# Patient Record
Sex: Female | Born: 1990 | Race: Black or African American | Hispanic: No | Marital: Married | Smoking: Current some day smoker
Health system: Southern US, Community
[De-identification: ages and names within clinical notes are randomized; demographics above are authoritative.]

## PROBLEM LIST (undated history)

## (undated) DIAGNOSIS — N760 Acute vaginitis: Secondary | ICD-10-CM

## (undated) DIAGNOSIS — B9689 Other specified bacterial agents as the cause of diseases classified elsewhere: Secondary | ICD-10-CM

---
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
## 2012-04-28 ENCOUNTER — Emergency Department (HOSPITAL_COMMUNITY)
Admission: EM | Admit: 2012-04-28 | Discharge: 2012-04-28 | Disposition: A | Discharge status: Home / Self Care | Payer: Self-pay | Attending: Emergency Medicine | Admitting: Emergency Medicine

## 2012-04-28 ENCOUNTER — Encounter (HOSPITAL_COMMUNITY): Payer: Self-pay

## 2012-04-28 DIAGNOSIS — J020 Streptococcal pharyngitis: Secondary | ICD-10-CM | POA: Insufficient documentation

## 2012-04-28 DIAGNOSIS — O98819 Other maternal infectious and parasitic diseases complicating pregnancy, unspecified trimester: Secondary | ICD-10-CM | POA: Insufficient documentation

## 2012-04-28 DIAGNOSIS — O99330 Smoking (tobacco) complicating pregnancy, unspecified trimester: Secondary | ICD-10-CM | POA: Insufficient documentation

## 2012-04-28 MED ORDER — PENICILLIN V POTASSIUM 500 MG PO TABS: 500.0000 mg | ORAL_TABLET | Freq: Four times a day (QID) | ORAL | Status: DC

## 2012-04-28 MED ORDER — PENICILLIN V POTASSIUM 250 MG PO TABS
500.0000 mg | ORAL_TABLET | Freq: Once | ORAL | Status: AC
  Administered 2012-04-28: 500 mg via ORAL
  Filled 2012-04-28: qty 2

## 2012-04-28 NOTE — ED Notes (Signed)
Called in waiting room x 2 no answer  

## 2012-04-28 NOTE — ED Notes (Signed)
Unable to locate pt  

## 2012-04-28 NOTE — ED Provider Notes (Signed)
History     CSN: [id number]  Arrival date & time 04/28/12  0012   None     Chief Complaint  Patient presents with  . Sore Throat    (Consider location/radiation/quality/duration/timing/severity/associated sxs/prior treatment) HPI Comments: She reports, that she's had a sore throat for over a week, with some chills, and myalgias.  She is also, reports, that she is 3, weeks pregnant [facility] with a quantitative beta her level was 84.  She is to followup with him on September 23 for a repeat quantitative beta.  She does not report any abdominal pain, discomfort, vaginal bleeding, or discharge  Patient is a 21 y.o. female presenting with pharyngitis. The history is provided by the patient.  Sore Throat The current episode started in the past 7 days. The problem occurs constantly. The problem has been unchanged. Associated symptoms include chills, a fever and a sore throat. Pertinent negatives include no anorexia, nausea or rash.    History reviewed. No pertinent past medical history.  History reviewed. No pertinent past surgical history.  History reviewed. No pertinent family history.  History  Substance Use Topics  . Smoking status: Current Some Day Smoker  . Smokeless tobacco: Not on file  . Alcohol Use: No    OB History    Grav Para Term Preterm Abortions TAB SAB Ect Mult Living   1               Review of Systems  Constitutional: Positive for fever and chills.  HENT: Positive for sore throat.   Gastrointestinal: Negative for nausea, diarrhea and anorexia.  Genitourinary: Negative for dysuria, vaginal bleeding, vaginal discharge and vaginal pain.  Skin: Negative for rash and wound.  Neurological: Negative for dizziness.    Allergies  Review of patient's allergies indicates no known allergies.  Home Medications   Current Outpatient Rx  Name Route Sig Dispense Refill  . DM-GUAIFENESIN ER 30-600 MG PO TB12 Oral Take 1 tablet by mouth every 12 (twelve)  hours.    Marland Kitchen PENICILLIN V POTASSIUM 500 MG PO TABS Oral Take 1 tablet (500 mg total) by mouth 4 (four) times daily. 39 tablet 0    BP 119/59  Pulse 84  Temp 98.5 F (36.9 C) (Oral)  Resp 20  Ht 5\' 1"  (1.549 m)  Wt 95 lb (43.092 kg)  BMI 17.95 kg/m2  SpO2 100%  Physical Exam  Constitutional: She appears well-developed and well-nourished.  HENT:  Head: Normocephalic. No trismus in the jaw.  Mouth/Throat: Uvula is midline. No oral lesions. No lacerations. Oropharyngeal exudate and posterior oropharyngeal erythema present. No posterior oropharyngeal edema or tonsillar abscesses.  Neck: Normal range of motion.  Cardiovascular: Normal rate.   Abdominal: Soft.  Musculoskeletal: Normal range of motion.  Neurological: She is alert.  Skin: Skin is warm.    ED Course  Procedures (including critical care time)  Labs Reviewed  RAPID STREP SCREEN - Abnormal; Notable for the following:    Streptococcus, Group A Screen (Direct) POSITIVE (*)     All other components within normal limits   No results found.   1. Strep pharyngitis       MDM   Verified with pharmacy that Pen-Vee K is safe in pregnancy and lactation.  We'll start this patient on a 10 day course of Pen-Vee K and have her followup on Monday as scheduled for repeat quantitative beta       Arman Filter, NP 04/28/12 0223  Arman Filter,  NP 04/28/12 1610

## 2012-04-28 NOTE — ED Notes (Signed)
Pt reports sore throat x1 week and (L) ear pain starting tonight. Pt reports she is [redacted] weeks pregnant w/no complications

## 2012-05-29 NOTE — ED Provider Notes (Signed)
Medical screening examination/treatment/procedure(s) were performed by non-physician practitioner and as supervising physician I was immediately available for consultation/collaboration.  Brandt Loosen, MD 05/29/12 [phone]

## 2014-03-06 ENCOUNTER — Encounter (HOSPITAL_COMMUNITY): Payer: Self-pay | Admitting: Emergency Medicine

## 2014-03-06 ENCOUNTER — Emergency Department (HOSPITAL_COMMUNITY)
Admission: EM | Admit: 2014-03-06 | Discharge: 2014-03-06 | Disposition: A | Discharge status: Home / Self Care | Payer: Medicaid Other | Attending: Emergency Medicine | Admitting: Emergency Medicine

## 2014-03-06 DIAGNOSIS — N9489 Other specified conditions associated with female genital organs and menstrual cycle: Secondary | ICD-10-CM | POA: Insufficient documentation

## 2014-03-06 DIAGNOSIS — F17200 Nicotine dependence, unspecified, uncomplicated: Secondary | ICD-10-CM | POA: Diagnosis not present

## 2014-03-06 DIAGNOSIS — L0231 Cutaneous abscess of buttock: Secondary | ICD-10-CM | POA: Diagnosis not present

## 2014-03-06 DIAGNOSIS — K612 Anorectal abscess: Secondary | ICD-10-CM | POA: Insufficient documentation

## 2014-03-06 DIAGNOSIS — N898 Other specified noninflammatory disorders of vagina: Secondary | ICD-10-CM

## 2014-03-06 DIAGNOSIS — L03317 Cellulitis of buttock: Principal | ICD-10-CM

## 2014-03-06 LAB — URINE MICROSCOPIC-ADD ON

## 2014-03-06 LAB — WET PREP, GENITAL
Clue Cells Wet Prep HPF POC: NONE SEEN
Trich, Wet Prep: NONE SEEN
YEAST WET PREP: NONE SEEN

## 2014-03-06 LAB — URINALYSIS, ROUTINE W REFLEX MICROSCOPIC
Bilirubin Urine: NEGATIVE
Glucose, UA: NEGATIVE mg/dL
KETONES UR: NEGATIVE mg/dL
NITRITE: NEGATIVE
PH: 6.5 (ref 5.0–8.0)
Protein, ur: NEGATIVE mg/dL
Specific Gravity, Urine: 1.027 (ref 1.005–1.030)
Urobilinogen, UA: 1 mg/dL (ref 0.0–1.0)

## 2014-03-06 MED ORDER — SULFAMETHOXAZOLE-TRIMETHOPRIM 800-160 MG PO TABS: 1.0000 | ORAL_TABLET | Freq: Two times a day (BID) | ORAL | Status: DC

## 2014-03-06 NOTE — ED Notes (Addendum)
Pt reports bump/ abscess on lower right buttocks, pain 9/10 x3 days. Reports there is a lump on her one of her vaginal walls that she noticed 2 days ago, denies vaginal pain. Pt does not have periods due to being on Depo shot.

## 2014-03-06 NOTE — ED Notes (Signed)
PA at bedside.

## 2014-03-06 NOTE — ED Notes (Signed)
Patient's mother driving her home. Pt ambulatory upon d/c

## 2014-03-06 NOTE — ED Notes (Addendum)
Pt aware of the need for a urine sample.   Pelvic exam supplies at bedside.

## 2014-03-06 NOTE — Discharge Instructions (Signed)
Bartholin's Cyst or Abscess Bartholin's glands are small glands located within the folds of skin (labia) along the sides of the lower opening of the vagina (birth canal). A cyst may develop when the duct of the gland becomes blocked. When this happens, fluid that accumulates within the cyst can become infected. This is known as an abscess. The Bartholin gland produces a mucous fluid to lubricate the outside of the vagina during sexual intercourse. SYMPTOMS   Patients with a small cyst may not have any symptoms.  Mild discomfort to severe pain depending on the size of the cyst and if it is infected (abscess).  Pain, redness, and swelling around the lower opening of the vagina.  Painful intercourse.  Pressure in the perineal area.  Swelling of the lips of the vagina (labia).  The cyst or abscess can be on one side or both sides of the vagina. DIAGNOSIS   A large swelling is seen in the lower vagina area by your caregiver.  Painful to touch.  Redness and pain, if it is an abscess. TREATMENT   Sometimes the cyst will go away on its own.  Apply warm wet compresses to the area or take hot sitz baths several times a day.  An incision to drain the cyst or abscess with local anesthesia.  Culture the pus, if it is an abscess.  Antibiotic treatment, if it is an abscess.  Cut open the gland and suture the edges to make the opening of the gland bigger (marsupialization).  Remove the whole gland if the cyst or abscess returns. PREVENTION   Practice good hygiene.  Clean the vaginal area with a mild soap and soft cloth when bathing.  Do not rub hard in the vaginal area when bathing.  Protect the crotch area with a padded cushion if you take long bike rides or ride horses.  Be sure you are well lubricated when you have sexual intercourse. HOME CARE INSTRUCTIONS   If your cyst or abscess was opened, a small piece of gauze, or a drain, may have been placed in the wound to allow  drainage. Do not remove this gauze or drain unless directed by your caregiver.  Wear feminine pads, not tampons, as needed for any drainage or bleeding.  If antibiotics were prescribed, take them exactly as directed. Finish the entire course.  Only take over-the-counter or prescription medicines for pain, discomfort, or fever as directed by your caregiver. SEEK IMMEDIATE MEDICAL CARE IF:   You have an increase in pain, redness, swelling, or drainage.  You have bleeding from the wound which results in the use of more than the number of pads suggested by your caregiver in 24 hours.  You have chills.  You have a fever.  You develop any new problems (symptoms) or aggravation of your existing condition. MAKE SURE YOU:   Understand these instructions.  Will watch your condition.  Will get help right away if you are not doing well or get worse. Document Released: 07/29/2005 Document Revised: 10/21/2011 Document Reviewed: 03/16/2008 [facility] Patient Information 2015 [place], [place]. This information is not intended to replace advice given to you by your health care provider. Make sure you discuss any questions you have with your health care provider.  Abscess Care After An abscess (also called a boil or furuncle) is an infected area that contains a collection of pus. Signs and symptoms of an abscess include pain, tenderness, redness, or hardness, or you may feel a moveable soft area under your skin. An  abscess can occur anywhere in the body. The infection may spread to surrounding tissues causing cellulitis. A cut (incision) by the surgeon was made over your abscess and the pus was drained out. Gauze may have been packed into the space to provide a drain that will allow the cavity to heal from the inside outwards. The boil may be painful for 5 to 7 days. Most people with a boil do not have high fevers. Your abscess, if seen early, may not have localized, and may not have been lanced. If not,  another appointment may be required for this if it does not get better on its own or with medications. HOME CARE INSTRUCTIONS   Only take over-the-counter or prescription medicines for pain, discomfort, or fever as directed by your caregiver.  When you bathe, soak and then remove gauze or iodoform packs at least daily or as directed by your caregiver. You may then wash the wound gently with mild soapy water. Repack with gauze or do as your caregiver directs. SEEK IMMEDIATE MEDICAL CARE IF:   You develop increased pain, swelling, redness, drainage, or bleeding in the wound site.  You develop signs of generalized infection including muscle aches, chills, fever, or a general ill feeling.  An oral temperature above 102 F (38.9 C) develops, not controlled by medication. See your caregiver for a recheck if you develop any of the symptoms described above. If medications (antibiotics) were prescribed, take them as directed. Document Released: 02/14/2005 Document Revised: 10/21/2011 Document Reviewed: 10/12/2007 [facility] Patient Information 2015 [place], [place]. This information is not intended to replace advice given to you by your health care provider. Make sure you discuss any questions you have with your health care provider.   Emergency Department Resource Guide 1) Find a Doctor and Pay Out of Pocket Although you won't have to find out who is covered by your insurance plan, it is a good idea to ask around and get recommendations. You will then need to call the office and see if the doctor you have chosen will accept you as a new patient and what types of options they offer for patients who are self-pay. Some doctors offer discounts or will set up payment plans for their patients who do not have insurance, but you will need to ask so you aren't surprised when you get to your appointment.  2) Contact Your Local Health Department Not all health departments have doctors that can see patients for  sick visits, but many do, so it is worth a call to see if yours does. If you don't know where your local health department is, you can check in your phone book. The CDC also has a tool to help you locate your state's health department, and many state websites also have listings of all of their local health departments.  3) Find a Walk-in Clinic If your illness is not likely to be very severe or complicated, you may want to try a walk in clinic. These are popping up all over the country in pharmacies, drugstores, and shopping centers. They're usually staffed by nurse practitioners or physician assistants that have been trained to treat common illnesses and complaints. They're usually fairly quick and inexpensive. However, if you have serious medical issues or chronic medical problems, these are probably not your best option.  No Primary Care Doctor: - Call Health Connect at  [phone] - they can help you locate a primary care doctor that  accepts your insurance, provides certain services, etc. - Physician  Referral Service- [phone]  Chronic Pain Problems: Organization         Address  Phone   Notes  [facility]  [phone] Patients need to be referred by their primary care doctor.   Medication Assistance: Organization         Address  Phone   Notes  [facility] Medication [facility] [address] [employer] [place], [place] [address] [phone] --Must be a resident of [facility] -- Must have NO insurance coverage whatsoever (no Medicaid/ Medicare, etc.) -- The pt. MUST have a primary care doctor that directs their care regularly and follows them in the community   [facility]  [phone]   [employer]  [phone]    Agencies that provide inexpensive medical care: Organization         Address  Phone   Notes  [facility]  [phone]   [facility]    [phone]   [facility]  [address] [place], [place] [address] [phone]   [facility] [address] [place] [place] [phone]   Planned Parenthood    [phone]   [facility]    [phone]   [facility]  [address], [place] Phone:  [phone], Fax:  [phone] Hours of Operation:  9 am - 6 pm, M-F.  Also accepts Medicaid/Medicare and self-pay.  [facility]  [address], [place] Phone: [phone], Fax: [phone]. Hours of Operation:  8:30 am - 5:30 pm, M-F.  Also accepts Medicaid and self-pay.  [facility] [place] [address] [place] Phone: [phone]   [facility] [address] Natasha Bence [place], [place] [phone], Ext. [phone] Mondays & Thursdays: 7-9 AM.  First 15 patients are seen on a first come, first serve basis.    Medicaid-accepting [facility] Providers:  Organization         Address  Phone   Notes  [facility] [address],  [phone] Also accepts self-pay patients.  [facility] [address] Laurell Josephs [place], [place]  [phone]   [facility] [address], [place] [phone]   [facility] [address], [place] [phone]   Renaye Rakers [address], [place]   [phone] Only accepts [place] patients after they have their name applied to their card.   Self-Pay (no insurance) in [place]:  Organization         Address  Phone   Notes  Sickle Cell Patients, [facility] [address] [place], [place] [phone]   [facility] [address] [place], [place] [phone]   [facility] [place]  [address], [place] [phone]   [facility]/Dr. Osei-Bonsu  [address] [place] or [address], [place] [phone] Phone number for both [place] and [place] locations is the same.  [facility] [address] [place] [phone]   [facility] [address] [place] or [address] [phone]   [facility] [address] [place], [place] [phone], phone; [phone], fax Sees patients 1st and 3rd Saturday of every month.  Must not qualify for public or private insurance (i.e. Medicaid, Medicare, [place] Health Choice, Veterans' Benefits)  Household income should be no more than 200% of the poverty level The clinic cannot treat you if you are pregnant or think you are pregnant  Sexually transmitted diseases are not treated at the clinic.    Dental Care: Organization         Address  Phone  Notes  [facility] of [facility] [address] [place], [place] [phone] Accepts children up to age 26 who are enrolled in [place] or [place] Health Choice; pregnant women with a Medicaid card; and children who have applied for Medicaid or [place] Health Choice, but were declined, whose parents can pay a reduced fee at time of service.  [facility] Department of [facility]  [address], [place] [phone] Accepts children up to age 36 who are enrolled in [place] or [place] Health Choice; pregnant women with a Medicaid card; and children who have applied for Medicaid or [place] Health Choice, but were declined, whose parents can pay a reduced fee at time of service.  [facility]  [address] [place], [place] [phone] Patients are seen by appointment only. Walk-ins are not accepted. [facility] will see patients 68 years of age and older. Monday - Tuesday (8am-5pm) Most Wednesdays (8:30-5pm) $30 per visit, cash only  [facility] Adult Dental Access PROGRAM  [address], [facility] [phone] Patients are seen by appointment only. Walk-ins are not accepted. [facility] will see patients 68 years of age and older. One Wednesday Evening (Monthly: Volunteer Based).  $30 per visit, cash only  [employer]  [phone] for adults; Children under age 67, call Graduate Pediatric Dentistry at [phone]. Children aged 78-14, please call [phone] to request a pediatric application.  Dental services are provided in all areas of dental care including fillings, crowns and bridges, complete and partial dentures, implants, gum treatment, root canals, and extractions. Preventive care is also provided. Treatment is provided to both adults and children. Patients are selected via a lottery and there is often a waiting list.   [facility] [address], [place]  [phone] [email or web address]   [facility] [address] [place], [place] [phone], Ext. [phone] Second and Fourth Thursday of each month, opens at 6:30 AM; Clinic ends at 9 AM.  Patients are seen on a first-come first-served basis, and a limited number are seen during each clinic.   [facility]  [address] Ether Griffins [place], [place] [phone]   Eligibility Requirements You must have lived in [place], [place], or [place] counties for at least the last three months.   You cannot be eligible for state or federal sponsored [place], including [employer], [place], or [employer].   You generally cannot be eligible for healthcare insurance through your employer.    How to apply: Eligibility screenings are held every Tuesday and Wednesday afternoon from 1:00 pm until 4:00 pm. You do not need an appointment for the interview!  [facility] [address] [place], [place] [phone]   [facility] Health Department  [phone]   [facility] Health Department  [phone]     [facility] Department  [phone]    Behavioral Health Resources in  the Community: Intensive Outpatient Programs Organization         Address  Phone  Notes  [facility] [address], [place], [place] [phone]   [facility] [address], [place], [place] [phone]   [facility] [address], [place], [place]  [phone]   [facility] [address],  [place], [place] [phone] or [phone]   Substance Abuse Resources Organization         Address  Phone  Notes  [facility]  [phone]   [facility]  [phone]   [facility]  [phone]   [facility]  [phone]   Residential & Outpatient Substance Abuse Program  [phone]   Psychological Services Organization         Address  Phone  Notes  [facility]  [phone]- [phone]   [facility]  [phone]- [phone]   [facility] [address], [place] [phone] or [phone]    Mobile Crisis Teams Organization         Address  Phone  Notes  [facility], Mobile Crisis Care Unit  [phone]   [facility]  [address] [place], [place] [phone]   Doristine LocksSharon DeEsch [address] [place] [phone]    Self-Help/Support Groups Organization         Address  Phone             Notes  [facility]ental Health Assoc. of [place] - variety of support groups  336- [phone] Call for more information  Narcotics Anonymous (NA), [facility] [address], [place]  2 meetings at this location   Statisticianesidential Treatment Programs Organization         Address  Phone  Notes  [facility] [address],    [place]  [phone]   [facility]  [address], [place] [phone], [place], [place] [phone]   [facility] [address], [place]  [phone] Admissions: 8am-3pm M-F  [facility] [address]    [place], [place] [phone]   [facility] [address], [place], [place] [phone]   [facility] [address]  [place], [place] [phone]   [facility] - Intensive Outpatient [address], [place], [place] [phone]   [facility] ([facility] [address]  [place], [place] [phone] or [phone]   [facility] ([facility]) [address] [place], [place] [phone] Accepts Medicaid  [facility] [address]  [place] [phone] Substance Abuse/Addiction Treatment   [facility] Resources Organization         Address  Phone  Notes  [facility]  [phone]) [phone]   Angie FavaJulie Brannon, PhD [address] [place], [place]   [phone] or [phone]   [facility]   [address] [place], [place] [phone]) [phone]   [facility] [address], [place], [place] [phone] Insurance/Medicaid/sponsorship through [employer] [facility]  [facility] [address]                                    [place], [place] [phone] Therapy/tele-psych/case  [facility] [address]   [place], [place] [phone]  Dr. Adele Schilder  [phone]   [facility] Dept. 1) [address], [place] 2) [place] 3)  [place] [address], [place] [phone]  [phone]   [place] [phone] or [phone] (After Hours)

## 2014-03-06 NOTE — ED Provider Notes (Signed)
CSN: [id number]     Arrival date & time 03/06/14  1041 History   First MD Initiated Contact with Patient 03/06/14 1108     Chief Complaint  Patient presents with  . Abscess   HPI Comments: Patient also mentions that she has a pea sized growth that is located on the labia of the vagina which she found two days ago.  Patient states that she has not had any vaginal pain, itching, rash, or discharge. She denies any urinary symptoms or changes in her bowel habits.  Patient is a 23 y.o. female presenting with abscess. The history is provided by the patient. No language interpreter was used.  Abscess Location:  Ano-genital Ano-genital abscess location:  R buttock Size:  3 in x 2 in Abscess quality: draining, fluctuance, induration, painful, redness and warmth   Abscess quality: no itching and not weeping   Red streaking: no   Duration:  4 days Progression:  Worsening Pain details:    Quality:  Sharp and shooting   Severity:  Severe   Duration:  4 days   Timing:  Constant   Progression:  Worsening Chronicity:  New Context: not diabetes, not immunosuppression, not injected drug use, not insect bite/sting and not skin injury   Relieved by:  Nothing Exacerbated by: pressure. Ineffective treatments:  NSAIDs, warm water soaks, warm compresses and topical antibiotics Associated symptoms: no anorexia, no fatigue, no fever, no headaches, no nausea and no vomiting   Risk factors: no family hx of MRSA, no hx of MRSA and no prior abscess     History reviewed. No pertinent past medical history. History reviewed. No pertinent past surgical history. History reviewed. No pertinent family history. History  Substance Use Topics  . Smoking status: Current Some Day Smoker  . Smokeless tobacco: Not on file  . Alcohol Use: No   OB History   Grav Para Term Preterm Abortions TAB SAB Ect Mult Living   1              Review of Systems  Constitutional: Negative for fever, chills and fatigue.    Gastrointestinal: Negative for nausea, vomiting and anorexia.  Genitourinary: Negative for dysuria, urgency, frequency, hematuria, flank pain, decreased urine volume, vaginal bleeding, vaginal discharge, vaginal pain, menstrual problem and pelvic pain.  Skin: Positive for wound.  Neurological: Negative for headaches.  All other systems reviewed and are negative.     Allergies  Review of patient's allergies indicates no known allergies.  Home Medications   Prior to Admission medications   Medication Sig Start Date End Date Taking? Authorizing Provider  acetaminophen (TYLENOL) 500 MG tablet Take 1,000 mg by mouth every 6 (six) hours as needed for mild pain.   Yes Historical Provider, MD  medroxyPROGESTERone (DEPO-PROVERA) 150 MG/ML injection Inject 150 mg into the muscle every 3 (three) months.   Yes Historical Provider, MD  sulfamethoxazole-trimethoprim (SEPTRA DS) 800-160 MG per tablet Take 1 tablet by mouth every 12 (twelve) hours. 03/06/14   Alea Ryer A Forcucci, PA-C   BP 99/52  Pulse 80  Temp(Src) 98.4 F (36.9 C) (Oral)  Resp 18  SpO2 100%  Breastfeeding? Unknown Physical Exam  Nursing note and vitals reviewed. Constitutional: She is oriented to person, place, and time. She appears well-developed and well-nourished. No distress.  HENT:  Head: Normocephalic and atraumatic.  Mouth/Throat: Oropharynx is clear and moist. No oropharyngeal exudate.  Eyes: Conjunctivae and EOM are normal. Pupils are equal, round, and reactive to light. No scleral icterus.  Neck: Normal range of motion. Neck supple. No JVD present. No thyromegaly present.  Cardiovascular: Normal rate, regular rhythm, normal heart sounds and intact distal pulses.  Exam reveals no gallop and no friction rub.   No murmur heard. Pulmonary/Chest: Effort normal and breath sounds normal. No respiratory distress. She has no wheezes. She has no rales. She exhibits no tenderness.  Abdominal: Soft. Bowel sounds are normal.  She exhibits no distension and no mass. There is no tenderness. There is no rebound and no guarding.  Genitourinary: Uterus normal.    Pelvic exam was performed with patient supine. Cervix exhibits no motion tenderness, no discharge and no friability. Right adnexum displays no mass, no tenderness and no fullness. Left adnexum displays no mass, no tenderness and no fullness. No erythema, tenderness or bleeding around the vagina. No foreign body around the vagina. No signs of injury around the vagina. No vaginal discharge found.  Musculoskeletal: Normal range of motion.  Lymphadenopathy:    She has no cervical adenopathy.  Neurological: She is alert and oriented to person, place, and time.  Skin: Skin is warm and dry. She is not diaphoretic.  3 in x 2 in abscess on the right buttock with some active drainage of yellow purulent substance.  Abscess has no surrounding erythema or warmth.  There is tenderness to palpation.  There is also a pustule on the right buttocks above the abscess with no fluctuance at this time.   Psychiatric: She has a normal mood and affect. Her behavior is normal. Judgment and thought content normal.    ED Course  INCISION AND DRAINAGE Date/Time: 03/06/2014 1:52 PM Performed by: Terri Piedra A Authorized by: Terri Piedra A Consent: Verbal consent obtained. Risks and benefits: risks, benefits and alternatives were discussed Consent given by: patient Patient understanding: patient states understanding of the procedure being performed Patient consent: the patient's understanding of the procedure matches consent given Relevant documents: relevant documents present and verified Test results: test results available and properly labeled Imaging studies: imaging studies available Patient identity confirmed: verbally with patient Time out: Immediately prior to procedure a "time out" was called to verify the correct patient, procedure, equipment, support staff and  site/side marked as required. Type: abscess Body area: anogenital Location details: perianal Anesthesia: local infiltration Local anesthetic: lidocaine 2% without epinephrine Anesthetic total: 3 ml Patient sedated: no Scalpel size: 10 Incision type: two simple straight incision made at both ends of the abscess. Complexity: simple Drainage: purulent and  bloody Drainage amount: moderate Wound treatment: wound left open Patient tolerance: Patient tolerated the procedure well with no immediate complications.   (including critical care time) Labs Review Labs Reviewed  WET PREP, GENITAL - Abnormal; Notable for the following:    WBC, Wet Prep HPF POC FEW (*)    All other components within normal limits  URINALYSIS, ROUTINE W REFLEX MICROSCOPIC - Abnormal; Notable for the following:    APPearance TURBID (*)    Hgb urine dipstick MODERATE (*)    Leukocytes, UA TRACE (*)    All other components within normal limits  URINE MICROSCOPIC-ADD ON - Abnormal; Notable for the following:    Squamous Epithelial / LPF FEW (*)    All other components within normal limits  GC/CHLAMYDIA PROBE AMP    Imaging Review No results found.   EKG Interpretation None      MDM   Final diagnoses:  Abscess of buttock, right  Vaginal mass   Patient is a 23 y.o. Female who presents  to the ED with multiple complaints.  Patient has abscess to the right buttock.  Abscess was I&D'd as seen above at this time.  Patient had multiple other pustules, but no surrounding cellulitis at this time.  I will prescribe the patient Bactrim DS BID x 7 days.  Patient also had small pea sized mass on the left labia.  Differential includes scar tissue and bartholin's gland.  Patient was told to follow-up with [facility] of her choice in [place] as that is where she is from.  Wet prep shows few WBCs but is negative for trichomonas, yeast, and BV.  GC is currently pending.  Urine appears to have hematuria and leukocytes, but suspect  that this is contamination as the patient had her abscess I&D'd prior to giving sample.  Bactrim will cover for UTI as well.  Patient discussed with Dr. Effie ShyWentz at this time.  He agrees with the above plan.  Patient is stable for discharge at this time.       Eben Burowourtney A Forcucci, PA-C 03/06/14 1402  Lily Peerourtney A Forcucci, PA-C 03/06/14 1402

## 2014-03-06 NOTE — ED Notes (Signed)
Pt ambulated to restroom with steady gait.

## 2014-03-07 LAB — GC/CHLAMYDIA PROBE AMP
CT PROBE, AMP APTIMA: NEGATIVE
GC Probe RNA: NEGATIVE

## 2014-03-07 NOTE — ED Provider Notes (Signed)
Medical screening examination/treatment/procedure(s) were performed by non-physician practitioner and as supervising physician I was immediately available for consultation/collaboration.   EKG Interpretation None       Flint MelterElliott L Tarea Skillman, MD 03/07/14 1157

## 2014-03-11 ENCOUNTER — Emergency Department (HOSPITAL_COMMUNITY)
Admission: EM | Admit: 2014-03-11 | Discharge: 2014-03-11 | Disposition: A | Discharge status: Home / Self Care | Payer: Medicaid Other | Attending: Emergency Medicine | Admitting: Emergency Medicine

## 2014-03-11 ENCOUNTER — Encounter (HOSPITAL_COMMUNITY): Payer: Self-pay | Admitting: Emergency Medicine

## 2014-03-11 DIAGNOSIS — Z792 Long term (current) use of antibiotics: Secondary | ICD-10-CM | POA: Diagnosis not present

## 2014-03-11 DIAGNOSIS — F17200 Nicotine dependence, unspecified, uncomplicated: Secondary | ICD-10-CM | POA: Insufficient documentation

## 2014-03-11 DIAGNOSIS — L0231 Cutaneous abscess of buttock: Secondary | ICD-10-CM | POA: Diagnosis present

## 2014-03-11 DIAGNOSIS — L03317 Cellulitis of buttock: Secondary | ICD-10-CM | POA: Diagnosis present

## 2014-03-11 DIAGNOSIS — L02214 Cutaneous abscess of groin: Secondary | ICD-10-CM

## 2014-03-11 MED ORDER — HYDROCODONE-ACETAMINOPHEN 5-325 MG PO TABS: 1.0000 | ORAL_TABLET | Freq: Four times a day (QID) | ORAL | Status: DC | PRN

## 2014-03-11 NOTE — ED Notes (Signed)
Pt states she was seen here last week for an abscess on her buttock and had it drained and was placed on antibiotics  Pt states at that time she also had a small bump on the inside of her vagina  Pt states she was told the antibiotic should help with both  States the abscess on her buttock has gotten better but the one inside her vagina has gotten bigger

## 2014-03-11 NOTE — Discharge Instructions (Signed)

## 2014-03-11 NOTE — ED Notes (Signed)
Patient states she was seen last week and had an abscess drained on her buttocks. Patient states this site improved after antibiotics and draining. Patient states at the same time she had an area to the left side of her labia. Patient states this has worsened.

## 2014-03-11 NOTE — ED Notes (Signed)
Lancing of vaginal abscess by Dr Read DriversMolpus, chaperoned by this writer.

## 2014-03-11 NOTE — ED Provider Notes (Addendum)
CSN: [id number]     Arrival date & time 03/11/14  [id number] History   First MD Initiated Contact with Patient 03/11/14 0501     Chief Complaint  Patient presents with  . Vaginal Pain     (Consider location/radiation/quality/duration/timing/severity/associated sxs/prior Treatment) HPI This is a 23 year old female who was seen on 03/06/2014 and had an abscess of the right buttock drained. At that time she had a pea-sized lump lateral to her left labium minus. She was discharged home on antibiotics. The abscess of the right buttocks has healed well and is no longer painful. The the lump on her vulva has increased in size steadily and is now moderately painful, worse with palpation or movement.   History reviewed. No pertinent past medical history. History reviewed. No pertinent past surgical history. History reviewed. No pertinent family history. History  Substance Use Topics  . Smoking status: Current Some Day Smoker  . Smokeless tobacco: Not on file  . Alcohol Use: No   OB History   Grav Para Term Preterm Abortions TAB SAB Ect Mult Living   1              Review of Systems  All other systems reviewed and are negative.   Allergies  Review of patient's allergies indicates no known allergies.  Home Medications   Prior to Admission medications   Medication Sig Start Date End Date Taking? Authorizing Provider  acetaminophen (TYLENOL) 500 MG tablet Take 1,000 mg by mouth every 6 (six) hours as needed for mild pain.   Yes Historical Provider, MD  ibuprofen (ADVIL,MOTRIN) 200 MG tablet Take 400 mg by mouth every 6 (six) hours as needed for moderate pain.   Yes Historical Provider, MD  medroxyPROGESTERone (DEPO-PROVERA) 150 MG/ML injection Inject 150 mg into the muscle every 3 (three) months.   Yes Historical Provider, MD  sulfamethoxazole-trimethoprim (SEPTRA DS) 800-160 MG per tablet Take 1 tablet by mouth every 12 (twelve) hours. 03/06/14  Yes Courtney A Forcucci, PA-C   BP 111/62   Pulse 88  Temp(Src) 98.2 F (36.8 C)  Resp 18  SpO2 100%  Physical Exam General: Well-developed, well-nourished female in no acute distress; appearance consistent with age of record HENT: normocephalic; atraumatic Eyes: pupils equal, round and reactive to light; extraocular muscles intact Neck: supple Heart: regular rate and rhythm Lungs: clear to auscultation bilaterally Abdomen: soft; nondistended GU: Tender mass in the fold between the left labium majus and labium minus consistent with an abscess Extremities: No deformity; full range of motion; pulses normal Neurologic: Awake, alert and oriented; motor function intact in all extremities and symmetric; no facial droop Skin: Warm and dry; well healing drained abscess of the right buttock Psychiatric: Normal mood and affect    ED Course  Procedures (including critical care time)  INCISION AND DRAINAGE Performed by: Paula LibraMOLPUS,Vinnie Gombert L Consent: Verbal consent obtained. Risks and benefits: risks, benefits and alternatives were discussed Type: abscess  Body area: Left vulva  Anesthesia: local infiltration  Incision was made with a scalpel.  Local anesthetic: lidocaine 2 % with epinephrine  Anesthetic total: 1 ml  Complexity: Simple Blunt dissection to break up loculations  Drainage: purulent  Drainage amount: Moderate   Packing material: None   Patient tolerance: Patient tolerated the procedure well with no immediate complications.     MDM      Hanley SeamenJohn L Forbes Loll, MD 03/11/14 [id number]  Hanley SeamenJohn L Elyssa Pendelton, MD 03/11/14 [id number]

## 2014-06-13 ENCOUNTER — Encounter (HOSPITAL_COMMUNITY): Payer: Self-pay | Admitting: Emergency Medicine

## 2015-01-16 ENCOUNTER — Emergency Department (HOSPITAL_COMMUNITY)
Admission: EM | Admit: 2015-01-16 | Discharge: 2015-01-16 | Disposition: A | Discharge status: Home / Self Care | Payer: Medicaid Other | Attending: Emergency Medicine | Admitting: Emergency Medicine

## 2015-01-16 ENCOUNTER — Encounter (HOSPITAL_COMMUNITY): Payer: Self-pay | Admitting: Emergency Medicine

## 2015-01-16 DIAGNOSIS — Z3202 Encounter for pregnancy test, result negative: Secondary | ICD-10-CM | POA: Insufficient documentation

## 2015-01-16 DIAGNOSIS — B9689 Other specified bacterial agents as the cause of diseases classified elsewhere: Secondary | ICD-10-CM

## 2015-01-16 DIAGNOSIS — N760 Acute vaginitis: Secondary | ICD-10-CM | POA: Insufficient documentation

## 2015-01-16 DIAGNOSIS — R21 Rash and other nonspecific skin eruption: Secondary | ICD-10-CM

## 2015-01-16 DIAGNOSIS — Z720 Tobacco use: Secondary | ICD-10-CM | POA: Insufficient documentation

## 2015-01-16 LAB — URINALYSIS, ROUTINE W REFLEX MICROSCOPIC
BILIRUBIN URINE: NEGATIVE
Glucose, UA: NEGATIVE mg/dL
Ketones, ur: NEGATIVE mg/dL
Nitrite: NEGATIVE
PH: 6 (ref 5.0–8.0)
PROTEIN: NEGATIVE mg/dL
Specific Gravity, Urine: 1.013 (ref 1.005–1.030)
Urobilinogen, UA: 0.2 mg/dL (ref 0.0–1.0)

## 2015-01-16 LAB — WET PREP, GENITAL
TRICH WET PREP: NONE SEEN
Yeast Wet Prep HPF POC: NONE SEEN

## 2015-01-16 LAB — PREGNANCY, URINE: PREG TEST UR: NEGATIVE

## 2015-01-16 LAB — URINE MICROSCOPIC-ADD ON

## 2015-01-16 MED ORDER — METRONIDAZOLE 500 MG PO TABS: 500.0000 mg | ORAL_TABLET | Freq: Two times a day (BID) | ORAL | Status: DC

## 2015-01-16 MED ORDER — DIPHENHYDRAMINE HCL 25 MG PO CAPS
25.0000 mg | ORAL_CAPSULE | Freq: Once | ORAL | Status: AC
  Administered 2015-01-16: 25 mg via ORAL
  Filled 2015-01-16: qty 1

## 2015-01-16 MED ORDER — DIPHENHYDRAMINE HCL 25 MG PO CAPS: 25.0000 mg | ORAL_CAPSULE | Freq: Four times a day (QID) | ORAL | Status: DC | PRN

## 2015-01-16 NOTE — Discharge Instructions (Signed)
Bacterial Vaginosis  Take Benadryl for rash on neck and face. Take metronidazole for bacterial vaginosis as prescribed. Avoid drinking alcohol 24 hours after last dose of medication use. Follow-up with women's outpatient clinic. Bacterial vaginosis is an infection of the vagina. It happens when too many of certain germs (bacteria) grow in the vagina. HOME CARE  Take your medicine as told by your doctor.  Finish your medicine even if you start to feel better.  Do not have sex until you finish your medicine and are better.  Tell your sex partner that you have an infection. They should see their doctor for treatment.  Practice safe sex. Use condoms. Have only one sex partner. GET HELP IF:  You are not getting better after 3 days of treatment.  You have more grey fluid (discharge) coming from your vagina than before.  You have more pain than before.  You have a fever. MAKE SURE YOU:   Understand these instructions.  Will watch your condition.  Will get help right away if you are not doing well or get worse. Document Released: 05/07/2008 Document Revised: 05/19/2013 Document Reviewed: 03/10/2013 [facility] Patient Information 2015 [employer] [place]. This information is not intended to replace advice given to you by your health care provider. Make sure you discuss any questions you have with your health care provider.

## 2015-01-16 NOTE — ED Notes (Signed)
Pt c/o burning and "hot" feeling onset 3 hours ago, followed by itching on face, neck, and legs. Pt denies any swelling, difficulty breathing, oral swelling, denies vaginal itch.

## 2015-01-16 NOTE — ED Provider Notes (Signed)
CSN: [id number]     Arrival date & time 01/16/15  1435 History  This chart was scribed for non-physician practitioner, Catha Gosselin, working with Gerhard Munch, MD by Richarda Overlie, ED Scribe. This patient was seen in room WTR9/WTR9 and the patient's care was started at 3:05 PM.   Chief Complaint  Patient presents with  . Pruritis   The history is provided by the patient. No language interpreter was used.   HPI Comments: Brianna Grant is a 24 y.o. female who presents to the Emergency Department complaining of pruritis that started 2 hours ago which initially started on her neck. Pt states she then starting having an itchy sensation on her face, ears, arms, lower back and legs. She states she has taken no medications PTA. Pt reports that she ate almonds and yogurt today which she typically eats. Pt reports that she got a wig 1 month ago and has noticed no symptoms until today. She reports no similar prior episodes. Pt states she has eaten no seafood in the last 24 hours. Pt reports no recent changes in soaps, detergents, or any other known exposures. She denies trouble breathing, tongue swelling, neck swelling or throat swelling.   Pt also complains of a vaginal odor for the last approximate 1 week. Pt reports associated vaginal discharge which is mostly clear but sometimes white. Pt reports her LNP was last month and says that she got off depo in October 2015 so her periods are beginning to become regular. She reports she has had 1 sexual partner in the last 6 months. Pt reports a hx of BV. She reports no hx of STDs. She denise dysuria, urinary frequency or vaginal bleeding.   History reviewed. No pertinent past medical history. History reviewed. No pertinent past surgical history. History reviewed. No pertinent family history. History  Substance Use Topics  . Smoking status: Current Some Day Smoker  . Smokeless tobacco: Not on file  . Alcohol Use: No   OB History    Gravida Para  Term Preterm AB TAB SAB Ectopic Multiple Living   1              Review of Systems  Constitutional: Negative for fever.  Respiratory: Negative for shortness of breath and wheezing.   Gastrointestinal: Negative for abdominal pain.  Genitourinary: Positive for vaginal discharge. Negative for dysuria, frequency, hematuria, vaginal bleeding and pelvic pain.  Skin: Positive for rash.  All other systems reviewed and are negative.     Allergies  Review of patient's allergies indicates no known allergies.  Home Medications   Prior to Admission medications   Medication Sig Start Date End Date Taking? Authorizing Provider  acetaminophen (TYLENOL) 500 MG tablet Take 1,000 mg by mouth every 6 (six) hours as needed for mild pain.    Historical Provider, MD  diphenhydrAMINE (BENADRYL) 25 mg capsule Take 1 capsule (25 mg total) by mouth every 6 (six) hours as needed. 01/16/15   Shamel Galyean Patel-Mills, PA-C  HYDROcodone-acetaminophen (NORCO) 5-325 MG per tablet Take 1-2 tablets by mouth every 6 (six) hours as needed (for pain). 03/11/14   John Molpus, MD  ibuprofen (ADVIL,MOTRIN) 200 MG tablet Take 400 mg by mouth every 6 (six) hours as needed for moderate pain.    Historical Provider, MD  medroxyPROGESTERone (DEPO-PROVERA) 150 MG/ML injection Inject 150 mg into the muscle every 3 (three) months.    Historical Provider, MD  metroNIDAZOLE (FLAGYL) 500 MG tablet Take 1 tablet (500 mg total) by mouth 2 (two)  times daily. 01/16/15   Athan Casalino Patel-Mills, PA-C  sulfamethoxazole-trimethoprim (SEPTRA DS) 800-160 MG per tablet Take 1 tablet by mouth every 12 (twelve) hours. 03/06/14   Courtney Forcucci, PA-C   BP 112/56 mmHg  Pulse 63  Temp(Src) 98.1 F (36.7 C) (Oral)  Resp 18  SpO2 98% Physical Exam  Constitutional: She is oriented to person, place, and time. She appears well-developed and well-nourished.  HENT:  Head: Normocephalic and atraumatic.  Mouth/Throat: Uvula is midline and oropharynx is clear and  moist. No trismus in the jaw. No uvula swelling. No oropharyngeal exudate, posterior oropharyngeal edema, posterior oropharyngeal erythema or tonsillar abscesses.  No tonsillar swelling or exudate.  Eyes: Conjunctivae are normal.  Neck: Neck supple. No tracheal deviation present.  No anterior cervical lymphadenopathy.  Cardiovascular: Normal rate, regular rhythm and normal heart sounds.   Pulmonary/Chest: Effort normal and breath sounds normal. No accessory muscle usage. No respiratory distress. She has no decreased breath sounds. She has no wheezes.  Abdominal: Soft. She exhibits no distension. There is no tenderness.  Genitourinary: Pelvic exam was performed with patient supine. There is no rash, tenderness, lesion or injury on the right labia. There is no rash, tenderness, lesion or injury on the left labia. Vaginal discharge found.  Pelvic exam, chaperone present: Copious amounts of malodorous white thick discharge. Cervical os is closed. No vaginal bleeding noted. No vaginal wall laceration. No adnexal tenderness.  Musculoskeletal: Normal range of motion.  Lymphadenopathy:    She has no cervical adenopathy.  Neurological: She is alert and oriented to person, place, and time.  Skin: Skin is warm and dry.  Psychiatric: She has a normal mood and affect.  Nursing note and vitals reviewed.   ED Course  Procedures   DIAGNOSTIC STUDIES: Oxygen Saturation is 99% on RA, normal by my interpretation.    COORDINATION OF CARE: 3:11 PM Discussed treatment plan with pt at bedside and pt agreed to plan.   Labs Review Labs Reviewed  WET PREP, GENITAL - Abnormal; Notable for the following:    Clue Cells Wet Prep HPF POC MODERATE (*)    WBC, Wet Prep HPF POC MODERATE (*)    All other components within normal limits  URINALYSIS, ROUTINE W REFLEX MICROSCOPIC (NOT AT [facility]) - Abnormal; Notable for the following:    APPearance CLOUDY (*)    Hgb urine dipstick TRACE (*)    Leukocytes, UA TRACE (*)     All other components within normal limits  URINE MICROSCOPIC-ADD ON - Abnormal; Notable for the following:    Squamous Epithelial / LPF FEW (*)    All other components within normal limits  URINE CULTURE  PREGNANCY, URINE  GC/CHLAMYDIA PROBE AMP ([place]) NOT AT [facility]    Imaging Review No results found.   EKG Interpretation None      MDM   Final diagnoses:  Bacterial vaginosis  Rash of face  Patient presents for rash to the neck and face. She has no acute respiratory distress or throat/tonsillar swelling.  No airway compromise. She also states that she has white odorous discharge for the last few days. She has no UTI and is not pregnant. She has a moderate amount of clue cells.  Medications  diphenhydrAMINE (BENADRYL) capsule 25 mg (25 mg Oral Given 01/16/15 1545)  I treated the patient for bacterial vaginosis with metronidazole and the rash with benadryl.  She can follow up with women's outpatient clinic.  I informed her that the hospital would call if the test  of the GC/Chlamydia results were positive.   I personally performed the services described in this documentation, which was scribed in my presence. The recorded information has been reviewed and is accurate.      Catha Gosselin, PA-C 01/16/15 2153  Gerhard Munch, MD 01/16/15 2351

## 2015-01-17 LAB — GC/CHLAMYDIA PROBE AMP (~~LOC~~) NOT AT ARMC
CHLAMYDIA, DNA PROBE: POSITIVE — AB
NEISSERIA GONORRHEA: NEGATIVE

## 2015-01-18 ENCOUNTER — Telehealth (HOSPITAL_BASED_OUTPATIENT_CLINIC_OR_DEPARTMENT_OTHER): Payer: Self-pay | Admitting: Emergency Medicine

## 2015-01-18 LAB — URINE CULTURE: Colony Count: 30000

## 2015-01-18 NOTE — Telephone Encounter (Signed)
Chart handoff to EDP for treatment plan for + Chlamydia 

## 2015-01-20 ENCOUNTER — Telehealth: Payer: Self-pay | Admitting: Emergency Medicine

## 2015-01-20 ENCOUNTER — Telehealth: Payer: Self-pay | Admitting: *Deleted

## 2015-01-20 NOTE — Telephone Encounter (Signed)
Post ED Visit - Positive Culture Follow-up: Successful Patient Follow-Up   Positive Chlamydia culture  [x]  Patient discharged without antimicrobial prescription and treatment is now indicated []  Organism is resistant to prescribed ED discharge antimicrobial []  Patient with positive blood cultures  Changes discussed with ED provider: [name], MD New antibiotic prescription:  Azithromycin Called to [facility] [phone]  Contacted patient, date 01/20/15, time 1514  patient returned call. ID verified, patient notified of positive Chlamydia and need for treatment. STD instructions provided. RX called to [facility] [phone].  [name] 01/20/2015, 3:23 PM

## 2015-11-26 ENCOUNTER — Emergency Department (HOSPITAL_COMMUNITY)
Admission: EM | Admit: 2015-11-26 | Discharge: 2015-11-26 | Disposition: A | Discharge status: Home / Self Care | Payer: Medicaid Other | Attending: Emergency Medicine | Admitting: Emergency Medicine

## 2015-11-26 ENCOUNTER — Encounter (HOSPITAL_COMMUNITY): Payer: Self-pay | Admitting: Emergency Medicine

## 2015-11-26 DIAGNOSIS — Z3202 Encounter for pregnancy test, result negative: Secondary | ICD-10-CM | POA: Insufficient documentation

## 2015-11-26 DIAGNOSIS — Y9389 Activity, other specified: Secondary | ICD-10-CM | POA: Insufficient documentation

## 2015-11-26 DIAGNOSIS — Y998 Other external cause status: Secondary | ICD-10-CM | POA: Insufficient documentation

## 2015-11-26 DIAGNOSIS — F17200 Nicotine dependence, unspecified, uncomplicated: Secondary | ICD-10-CM | POA: Insufficient documentation

## 2015-11-26 DIAGNOSIS — Y9289 Other specified places as the place of occurrence of the external cause: Secondary | ICD-10-CM | POA: Insufficient documentation

## 2015-11-26 DIAGNOSIS — T192XXA Foreign body in vulva and vagina, initial encounter: Secondary | ICD-10-CM | POA: Insufficient documentation

## 2015-11-26 DIAGNOSIS — X58XXXA Exposure to other specified factors, initial encounter: Secondary | ICD-10-CM | POA: Insufficient documentation

## 2015-11-26 DIAGNOSIS — Z792 Long term (current) use of antibiotics: Secondary | ICD-10-CM | POA: Insufficient documentation

## 2015-11-26 HISTORY — DX: Other specified bacterial agents as the cause of diseases classified elsewhere: B96.89

## 2015-11-26 HISTORY — DX: Other specified bacterial agents as the cause of diseases classified elsewhere: N76.0

## 2015-11-26 LAB — WET PREP, GENITAL
Clue Cells Wet Prep HPF POC: NONE SEEN
Sperm: NONE SEEN
Trich, Wet Prep: NONE SEEN
WBC, Wet Prep HPF POC: NONE SEEN
Yeast Wet Prep HPF POC: NONE SEEN

## 2015-11-26 LAB — I-STAT BETA HCG BLOOD, ED (MC, WL, AP ONLY)

## 2015-11-26 MED ORDER — METRONIDAZOLE 0.75 % VA GEL: 1.0000 | Freq: Two times a day (BID) | VAGINAL | Status: DC

## 2015-11-26 NOTE — Discharge Instructions (Signed)
You were seen and evaluated today for the foreign body that was spontaneously removed from your vagina. It appears it was an old tampon. Return with sudden skin rash, fever. Follow up outpatient as needed with your OB/GYN. Take a full one-week course of the metronidazole.  Vaginal Foreign Body A vaginal foreign body is any object that gets stuck or left inside the vagina. Vaginal foreign bodies left in the vagina for a long time can cause irritation and infection. In most cases, symptoms go away once the vaginal foreign body is found and removed. Rarely, a foreign object can break through the walls of the vagina and cause a serious infection inside the abdomen.  CAUSES  The most common vaginal foreign bodies are:  Tampons.  Contraceptive devices.  Toilet tissue left in the vagina.  Small objects that were placed in the vagina out of curiosity and got stuck.  A result of sexual abuse. SIGNS AND SYMPTOMS  Light vaginal bleeding.  Blood-tinged vaginal fluid (discharge).  Vaginal discharge that smells bad.  Vaginal itching or burning.  Redness, swelling, or rash near the opening of the vagina.  Abdominal pain.  Fever.  Burning or frequent urination. DIAGNOSIS  Your health care provider may be able to diagnose a vaginal foreign body based on the information you provide, your symptoms, and a physical exam. Your health care provider may also perform the following tests to check for infection:  A swab of the discharge to check under a microscope for bacteria (culture).  A urine culture.  An examination of the vagina with a small, lighted scope (vaginoscopy).  Imaging tests to get a picture of the inside of your vagina, such as:  Ultrasound.  X-ray.  MRI. TREATMENT  In most cases, a vaginal foreign body can be easily removed and the symptoms usually go away very quickly. Other treatment may include:   If the vaginal foreign body is not easily removed, medicine may be given  to make you go to sleep (general anesthesia) to have the object removed.  Emergency surgery may be necessary if an infection spreads through the walls of the vagina into the abdomen (acute abdomen). This is rare.  You may need to take antibiotic medicine if you have a vaginal or urinary tract infection. HOME CARE INSTRUCTIONS   Take medicines only as directed by your health care provider.  If you were prescribed an antibiotic medicine, finish it all even if you start to feel better.  Do not have sex or use tampons until your health care provider says it is okay.  Do not douche or use vaginal rinses unless your health care provider recommends it.  Keep all follow-up visits as directed by your health care provider. This is important. SEEK MEDICAL CARE IF:  You have abdominal pain or burning pain when urinating.  You have a fever. SEEK IMMEDIATE MEDICAL CARE IF:  You have heavy vaginal bleeding or discharge.   You have very bad abdominal pain.  MAKE SURE YOU:  Understand these instructions.  Will watch your condition.  Will get help right away if you are not doing well or get worse.   This information is not intended to replace advice given to you by your health care provider. Make sure you discuss any questions you have with your health care provider.   Document Released: 12/13/2013 Document Reviewed: 12/13/2013 Elsevier Interactive Patient Education [employer]2016 Elsevier Inc.

## 2015-11-26 NOTE — ED Provider Notes (Signed)
CSN: [id number]     Arrival date & time 11/26/15  1546 History   First MD Initiated Contact with Patient 11/26/15 1625     Chief Complaint  Patient presents with  . Vaginal Discharge     (Consider location/radiation/quality/duration/timing/severity/associated sxs/prior Treatment) HPI Comments: 25 year old female with recent history of recurrent BV presents for vaginal discharge. The patient reports she was having a bowel movement and then pushed out through her vagina a white tissue like glob. She denies any pelvic pain. No vaginal bleeding. She reports she has been using intravaginal metronidazole for BV infection. She has been sexually active without any pain. She is actively trying to get pregnant. Her last menses finished over 2-1/2 weeks ago. She is due for her period on April 22.  Patient is a 25 y.o. female presenting with vaginal discharge.  Vaginal Discharge Associated symptoms: no abdominal pain, no dysuria, no fever and no vomiting     Past Medical History  Diagnosis Date  . BV (bacterial vaginosis)    History reviewed. No pertinent past surgical history. No family history on file. Social History  Substance Use Topics  . Smoking status: Current Some Day Smoker  . Smokeless tobacco: None  . Alcohol Use: No   OB History    Gravida Para Term Preterm AB TAB SAB Ectopic Multiple Living   1              Review of Systems  Constitutional: Negative for fever, chills and fatigue.  HENT: Negative for congestion, postnasal drip and rhinorrhea.   Eyes: Negative for visual disturbance.  Respiratory: Negative for shortness of breath.   Cardiovascular: Negative for chest pain.  Gastrointestinal: Negative for vomiting and abdominal pain.  Genitourinary: Positive for vaginal discharge. Negative for dysuria, frequency, vaginal bleeding, vaginal pain, menstrual problem and pelvic pain.  Skin: Negative for wound.  Neurological: Negative for dizziness and light-headedness.   Hematological: Does not bruise/bleed easily.      Allergies  Review of patient's allergies indicates no known allergies.  Home Medications   Prior to Admission medications   Medication Sig Start Date End Date Taking? Authorizing Provider  metroNIDAZOLE (METROGEL) 0.75 % vaginal gel Place 1 Applicatorful vaginally 2 (two) times daily.   Yes Historical Provider, MD  diphenhydrAMINE (BENADRYL) 25 mg capsule Take 1 capsule (25 mg total) by mouth every 6 (six) hours as needed. 01/16/15   Hanna Patel-Mills, PA-C  HYDROcodone-acetaminophen (NORCO) 5-325 MG per tablet Take 1-2 tablets by mouth every 6 (six) hours as needed (for pain). 03/11/14   John Molpus, MD  metroNIDAZOLE (FLAGYL) 500 MG tablet Take 1 tablet (500 mg total) by mouth 2 (two) times daily. 01/16/15   Hanna Patel-Mills, PA-C  sulfamethoxazole-trimethoprim (SEPTRA DS) 800-160 MG per tablet Take 1 tablet by mouth every 12 (twelve) hours. 03/06/14   Courtney Forcucci, PA-C   BP 116/75 mmHg  Pulse 59  Temp(Src) 98.2 F (36.8 C) (Oral)  Resp 18  Ht  (1.549 m)  Wt 94 lb (42.638 kg)  BMI 17.77 kg/m2  SpO2 100%  LMP 11/01/2015  Breastfeeding? No Physical Exam  Constitutional: She is oriented to person, place, and time. She appears well-developed and well-nourished. No distress.  HENT:  Head: Normocephalic and atraumatic.  Right Ear: External ear normal.  Left Ear: External ear normal.  Nose: Nose normal.  Mouth/Throat: Oropharynx is clear and moist. No oropharyngeal exudate.  Eyes: EOM are normal. Pupils are equal, round, and reactive to light.  Neck: Normal range of  motion. Neck supple.  Cardiovascular: Normal rate, regular rhythm, normal heart sounds and intact distal pulses.   No murmur heard. Pulmonary/Chest: Effort normal. No respiratory distress. She has no wheezes. She has no rales.  Abdominal: Soft. She exhibits no distension. There is no tenderness.  Genitourinary: Uterus normal. Cervix exhibits discharge (whitish  and thick, yellowish gel also noted within the vaginal vault). Cervix exhibits no motion tenderness and no friability. Right adnexum displays no mass, no tenderness and no fullness. Left adnexum displays no mass, no tenderness and no fullness. No erythema or tenderness in the vagina. No foreign body around the vagina. No signs of injury around the vagina. Vaginal discharge (whitish, thick) found.  Musculoskeletal: Normal range of motion. She exhibits no edema or tenderness.  Neurological: She is alert and oriented to person, place, and time.  Skin: Skin is warm and dry. No rash noted. She is not diaphoretic.  Vitals reviewed.   ED Course  Procedures (including critical care time) Labs Review Labs Reviewed  WET PREP, GENITAL  I-STAT BETA HCG BLOOD, ED ([facility], [facility], [facility] ONLY)  GC/CHLAMYDIA PROBE AMP ([place]) NOT AT [facility]    Imaging Review No results found. I have personally reviewed and evaluated these images and lab results as part of my medical decision-making.   EKG Interpretation None      MDM  Patient was seen and evaluated in stable condition. Substance from her vagina inspected at bedside. This substance is an old tampon that is covered in gel and mucus. Once this strain was pulled back it was very obvious what the foreign object was. On pelvic examination no other foreign bodies were found within the vaginal vault. She did have some discharge consistent with BV. She is on day 4 of her intravaginal BV. We will extend this for a full new course in light of the findings. She was discharged home in stable condition with instruction to follow up outpatient. She expressed understanding and agreement with plan of care. Final diagnoses:  None    1. Intravaginal foreign body, spontaneous removal  Leta BaptistEmily Roe Khaden Gater, MD 11/26/15 1747

## 2015-11-26 NOTE — ED Notes (Addendum)
Pt c/o white clumpy fleshy looking substance "dropped out her vaginal area" Pt has the specimen with her. Pt reports substance is hard in texture. Pt was in bathroom having a bowel movement when it happened. Pt recently treated for BV with metronidazole gel.

## 2015-11-27 LAB — GC/CHLAMYDIA PROBE AMP (~~LOC~~) NOT AT ARMC
Chlamydia: NEGATIVE
Neisseria Gonorrhea: NEGATIVE

## 2015-12-12 ENCOUNTER — Emergency Department (HOSPITAL_COMMUNITY): Payer: No Typology Code available for payment source

## 2015-12-12 ENCOUNTER — Encounter (HOSPITAL_COMMUNITY): Payer: Self-pay | Admitting: Emergency Medicine

## 2015-12-12 ENCOUNTER — Emergency Department (HOSPITAL_COMMUNITY)
Admission: EM | Admit: 2015-12-12 | Discharge: 2015-12-12 | Disposition: A | Discharge status: Home / Self Care | Payer: No Typology Code available for payment source | Attending: Emergency Medicine | Admitting: Emergency Medicine

## 2015-12-12 DIAGNOSIS — Y998 Other external cause status: Secondary | ICD-10-CM | POA: Diagnosis not present

## 2015-12-12 DIAGNOSIS — S0990XA Unspecified injury of head, initial encounter: Secondary | ICD-10-CM | POA: Diagnosis not present

## 2015-12-12 DIAGNOSIS — M545 Low back pain, unspecified: Secondary | ICD-10-CM

## 2015-12-12 DIAGNOSIS — Z3202 Encounter for pregnancy test, result negative: Secondary | ICD-10-CM | POA: Diagnosis not present

## 2015-12-12 DIAGNOSIS — S29002A Unspecified injury of muscle and tendon of back wall of thorax, initial encounter: Secondary | ICD-10-CM | POA: Diagnosis not present

## 2015-12-12 DIAGNOSIS — Y92410 Unspecified street and highway as the place of occurrence of the external cause: Secondary | ICD-10-CM | POA: Insufficient documentation

## 2015-12-12 DIAGNOSIS — Y9389 Activity, other specified: Secondary | ICD-10-CM | POA: Diagnosis not present

## 2015-12-12 DIAGNOSIS — F17200 Nicotine dependence, unspecified, uncomplicated: Secondary | ICD-10-CM | POA: Diagnosis not present

## 2015-12-12 DIAGNOSIS — Z8742 Personal history of other diseases of the female genital tract: Secondary | ICD-10-CM | POA: Diagnosis not present

## 2015-12-12 DIAGNOSIS — Z792 Long term (current) use of antibiotics: Secondary | ICD-10-CM | POA: Insufficient documentation

## 2015-12-12 DIAGNOSIS — S199XXA Unspecified injury of neck, initial encounter: Secondary | ICD-10-CM | POA: Diagnosis not present

## 2015-12-12 DIAGNOSIS — S3992XA Unspecified injury of lower back, initial encounter: Secondary | ICD-10-CM | POA: Insufficient documentation

## 2015-12-12 LAB — POC URINE PREG, ED: Preg Test, Ur: NEGATIVE

## 2015-12-12 MED ORDER — CYCLOBENZAPRINE HCL 10 MG PO TABS: 10.0000 mg | ORAL_TABLET | Freq: Two times a day (BID) | ORAL | Status: DC | PRN

## 2015-12-12 MED ORDER — HYDROCODONE-ACETAMINOPHEN 5-325 MG PO TABS
1.0000 | ORAL_TABLET | Freq: Once | ORAL | Status: AC
  Administered 2015-12-12: 1 via ORAL
  Filled 2015-12-12: qty 1

## 2015-12-12 MED ORDER — NAPROXEN 500 MG PO TABS: 500.0000 mg | ORAL_TABLET | Freq: Two times a day (BID) | ORAL | Status: DC
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
## 2015-12-12 NOTE — ED Notes (Signed)
PT DISCHARGED. INSTRUCTIONS AND PRESCRIPTIONS GIVEN. AAOX3. PT IN NO APPARENT DISTRESS. THE OPPORTUNITY TO ASK QUESTIONS WAS PROVIDED. 

## 2015-12-12 NOTE — ED Notes (Signed)
Pt disgruntled because she has not gone back to room yet. It was explained to the patient, as it was when she first arrived, that she was waiting in a triage room for a fast tract room in the back as a courtesy because she had a C-collar on and she would be more comfortable in this room. Pt was alert, oriented and stable appearing when speaking to her. Boyfriend was at bedside as well.
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
## 2016-02-15 ENCOUNTER — Other Ambulatory Visit (HOSPITAL_COMMUNITY): Payer: Self-pay | Admitting: Nurse Practitioner

## 2016-02-15 DIAGNOSIS — Z3A12 12 weeks gestation of pregnancy: Secondary | ICD-10-CM

## 2016-02-15 DIAGNOSIS — Z3682 Encounter for antenatal screening for nuchal translucency: Secondary | ICD-10-CM

## 2016-02-25 LAB — OB RESULTS CONSOLE GC/CHLAMYDIA
Chlamydia: NEGATIVE
GC PROBE AMP, GENITAL: NEGATIVE

## 2016-02-25 LAB — OB RESULTS CONSOLE ANTIBODY SCREEN: Antibody Screen: NEGATIVE

## 2016-02-25 LAB — OB RESULTS CONSOLE ABO/RH: RH TYPE: POSITIVE

## 2016-02-25 LAB — OB RESULTS CONSOLE HIV ANTIBODY (ROUTINE TESTING): HIV: NONREACTIVE

## 2016-02-25 LAB — OB RESULTS CONSOLE RUBELLA ANTIBODY, IGM: Rubella: IMMUNE

## 2016-02-25 LAB — OB RESULTS CONSOLE HEPATITIS B SURFACE ANTIGEN: HEP B S AG: NEGATIVE

## 2016-02-25 LAB — OB RESULTS CONSOLE RPR: RPR: NONREACTIVE

## 2016-02-29 ENCOUNTER — Ambulatory Visit (HOSPITAL_COMMUNITY)
Admission: RE | Admit: 2016-02-29 | Discharge: 2016-02-29 | Disposition: A | Discharge status: Home / Self Care | Payer: Medicaid Other | Source: Ambulatory Visit | Attending: Nurse Practitioner | Admitting: Nurse Practitioner

## 2016-02-29 ENCOUNTER — Encounter (HOSPITAL_COMMUNITY): Payer: Self-pay

## 2016-02-29 DIAGNOSIS — Z3682 Encounter for antenatal screening for nuchal translucency: Secondary | ICD-10-CM

## 2016-02-29 DIAGNOSIS — Z3A12 12 weeks gestation of pregnancy: Secondary | ICD-10-CM | POA: Diagnosis not present

## 2016-02-29 DIAGNOSIS — Z36 Encounter for antenatal screening of mother: Secondary | ICD-10-CM | POA: Diagnosis present

## 2016-03-14 ENCOUNTER — Other Ambulatory Visit (HOSPITAL_COMMUNITY): Payer: Self-pay

## 2016-08-14 LAB — OB RESULTS CONSOLE GC/CHLAMYDIA
CHLAMYDIA, DNA PROBE: NEGATIVE
Gonorrhea: NEGATIVE

## 2016-08-14 LAB — OB RESULTS CONSOLE GBS: GBS: POSITIVE

## 2016-09-09 ENCOUNTER — Inpatient Hospital Stay (HOSPITAL_COMMUNITY)
Admission: AD | Admit: 2016-09-09 | Discharge: 2016-09-12 | DRG: 765 | Disposition: A | Discharge status: Home / Self Care | Payer: Medicaid Other | Source: Ambulatory Visit | Attending: Family Medicine | Admitting: Family Medicine

## 2016-09-09 ENCOUNTER — Encounter (HOSPITAL_COMMUNITY): Payer: Self-pay | Admitting: *Deleted

## 2016-09-09 DIAGNOSIS — O99824 Streptococcus B carrier state complicating childbirth: Secondary | ICD-10-CM | POA: Diagnosis present

## 2016-09-09 DIAGNOSIS — O4202 Full-term premature rupture of membranes, onset of labor within 24 hours of rupture: Secondary | ICD-10-CM | POA: Diagnosis present

## 2016-09-09 DIAGNOSIS — O323XX0 Maternal care for face, brow and chin presentation, not applicable or unspecified: Secondary | ICD-10-CM | POA: Diagnosis present

## 2016-09-09 DIAGNOSIS — Z3A40 40 weeks gestation of pregnancy: Secondary | ICD-10-CM | POA: Diagnosis not present

## 2016-09-09 DIAGNOSIS — O99019 Anemia complicating pregnancy, unspecified trimester: Secondary | ICD-10-CM | POA: Diagnosis not present

## 2016-09-09 DIAGNOSIS — D62 Acute posthemorrhagic anemia: Secondary | ICD-10-CM | POA: Diagnosis not present

## 2016-09-09 DIAGNOSIS — O99334 Smoking (tobacco) complicating childbirth: Secondary | ICD-10-CM | POA: Diagnosis present

## 2016-09-09 DIAGNOSIS — F17200 Nicotine dependence, unspecified, uncomplicated: Secondary | ICD-10-CM | POA: Diagnosis present

## 2016-09-09 DIAGNOSIS — O9081 Anemia of the puerperium: Secondary | ICD-10-CM | POA: Diagnosis not present

## 2016-09-09 DIAGNOSIS — O99820 Streptococcus B carrier state complicating pregnancy: Secondary | ICD-10-CM | POA: Diagnosis not present

## 2016-09-09 LAB — CBC
HCT: 33.2 % — ABNORMAL LOW (ref 36.0–46.0)
HEMOGLOBIN: 10.8 g/dL — AB (ref 12.0–15.0)
MCH: 27.5 pg (ref 26.0–34.0)
MCHC: 32.5 g/dL (ref 30.0–36.0)
MCV: 84.5 fL (ref 78.0–100.0)
PLATELETS: 120 10*3/uL — AB (ref 150–400)
RBC: 3.93 MIL/uL (ref 3.87–5.11)
RDW: 14.4 % (ref 11.5–15.5)
WBC: 7 10*3/uL (ref 4.0–10.5)

## 2016-09-09 LAB — POCT FERN TEST: POCT FERN TEST: POSITIVE

## 2016-09-09 MED ORDER — FLEET ENEMA 7-19 GM/118ML RE ENEM: 1.0000 | ENEMA | RECTAL | Status: DC | PRN

## 2016-09-09 MED ORDER — LACTATED RINGERS IV SOLN
INTRAVENOUS | Status: DC
  Administered 2016-09-09 – 2016-09-10 (×3): via INTRAVENOUS

## 2016-09-09 MED ORDER — OXYTOCIN 40 UNITS IN LACTATED RINGERS INFUSION - SIMPLE MED
2.5000 [IU]/h | INTRAVENOUS | Status: DC
  Filled 2016-09-09: qty 1000

## 2016-09-09 MED ORDER — OXYTOCIN BOLUS FROM INFUSION: 500.0000 mL | Freq: Once | INTRAVENOUS | Status: DC

## 2016-09-09 MED ORDER — SOD CITRATE-CITRIC ACID 500-334 MG/5ML PO SOLN
30.0000 mL | ORAL | Status: DC | PRN
Start: 2016-09-09 — End: 2016-09-10
  Administered 2016-09-10: 30 mL via ORAL
  Filled 2016-09-09: qty 15

## 2016-09-09 MED ORDER — LACTATED RINGERS IV SOLN
500.0000 mL | INTRAVENOUS | Status: DC | PRN
  Administered 2016-09-10: 500 mL via INTRAVENOUS

## 2016-09-09 MED ORDER — EPHEDRINE 5 MG/ML INJ: 10.0000 mg | INTRAVENOUS | Status: DC | PRN

## 2016-09-09 MED ORDER — ACETAMINOPHEN 325 MG PO TABS: 650.0000 mg | ORAL_TABLET | ORAL | Status: DC | PRN

## 2016-09-09 MED ORDER — DIPHENHYDRAMINE HCL 50 MG/ML IJ SOLN: 12.5000 mg | INTRAMUSCULAR | Status: DC | PRN

## 2016-09-09 MED ORDER — PHENYLEPHRINE 40 MCG/ML (10ML) SYRINGE FOR IV PUSH (FOR BLOOD PRESSURE SUPPORT)
80.0000 ug | PREFILLED_SYRINGE | INTRAVENOUS | Status: DC | PRN
  Administered 2016-09-10: 80 ug via INTRAVENOUS

## 2016-09-09 MED ORDER — LACTATED RINGERS IV SOLN: 500.0000 mL | Freq: Once | INTRAVENOUS | Status: DC

## 2016-09-09 MED ORDER — PENICILLIN G POT IN DEXTROSE 60000 UNIT/ML IV SOLN
3.0000 10*6.[IU] | INTRAVENOUS | Status: DC
  Administered 2016-09-09 – 2016-09-10 (×5): 3 10*6.[IU] via INTRAVENOUS
  Filled 2016-09-09 (×9): qty 50

## 2016-09-09 MED ORDER — FENTANYL CITRATE (PF) 100 MCG/2ML IJ SOLN
100.0000 ug | INTRAMUSCULAR | Status: DC | PRN
  Administered 2016-09-09: 100 ug via INTRAVENOUS
  Filled 2016-09-09: qty 2

## 2016-09-09 MED ORDER — PHENYLEPHRINE 40 MCG/ML (10ML) SYRINGE FOR IV PUSH (FOR BLOOD PRESSURE SUPPORT)
80.0000 ug | PREFILLED_SYRINGE | INTRAVENOUS | Status: DC | PRN
Start: 2016-09-09 — End: 2016-09-10
  Filled 2016-09-09 (×3): qty 10

## 2016-09-09 MED ORDER — ONDANSETRON HCL 4 MG/2ML IJ SOLN
4.0000 mg | Freq: Four times a day (QID) | INTRAMUSCULAR | Status: DC | PRN
  Administered 2016-09-10: 4 mg via INTRAVENOUS
  Filled 2016-09-09: qty 2

## 2016-09-09 MED ORDER — FENTANYL 2.5 MCG/ML BUPIVACAINE 1/10 % EPIDURAL INFUSION (WH - ANES)
14.0000 mL/h | INTRAMUSCULAR | Status: DC | PRN
  Administered 2016-09-10 (×3): 14 mL/h via EPIDURAL
  Filled 2016-09-09 (×3): qty 100

## 2016-09-09 MED ORDER — OXYCODONE-ACETAMINOPHEN 5-325 MG PO TABS: 2.0000 | ORAL_TABLET | ORAL | Status: DC | PRN

## 2016-09-09 MED ORDER — LIDOCAINE HCL (PF) 1 % IJ SOLN
30.0000 mL | INTRAMUSCULAR | Status: DC | PRN
  Filled 2016-09-09: qty 30

## 2016-09-09 MED ORDER — SODIUM CHLORIDE 0.9 % IV SOLN
2.0000 g | Freq: Once | INTRAVENOUS | Status: AC
  Administered 2016-09-09: 2 g via INTRAVENOUS
  Filled 2016-09-09: qty 2000

## 2016-09-09 MED ORDER — OXYCODONE-ACETAMINOPHEN 5-325 MG PO TABS: 1.0000 | ORAL_TABLET | ORAL | Status: DC | PRN

## 2016-09-09 NOTE — MAU Note (Signed)
Had a gush of fluid around 1515, clear fluid.  Denies cramping.

## 2016-09-09 NOTE — H&P (Signed)
LABOR AND DELIVERY ADMISSION HISTORY AND PHYSICAL NOTE  Brianna Grant is a 26 y.o. female G2P1001 with IUP at 3439w2d by LMP presenting after SROM around 1500. She denies any CP, SOB, HA or changes in vision.  No NVD or LE swelling.  She reports positive fetal movement and denies vaginal bleeding.  Prenatal History/Complications:  Past Medical History: Past Medical History:  Diagnosis Date  . BV (bacterial vaginosis)     Past Surgical History: History reviewed. No pertinent surgical history.  Obstetrical History: OB History    Gravida Para Term Preterm AB Living   2 1 1     1    SAB TAB Ectopic Multiple Live Births                  Social History: Social History   Social History  . Marital status: Single    Spouse name: N/A  . Number of children: N/A  . Years of education: N/A   Social History Main Topics  . Smoking status: Current Some Day Smoker  . Smokeless tobacco: Never Used  . Alcohol use No  . Drug use: No  . Sexual activity: Yes    Birth control/ protection: None   Other Topics Concern  . None   Social History Narrative  . None    Family History: History reviewed. No pertinent family history.  Allergies: No Known Allergies  Prescriptions Prior to Admission  Medication Sig Dispense Refill Last Dose  . Prenatal Vit-Fe Fumarate-FA (PRENATAL VITAMIN PO) Take 1 tablet by mouth every morning.    09/09/2016 at Unknown time  . cyclobenzaprine (FLEXERIL) 10 MG tablet Take 1 tablet (10 mg total) by mouth 2 (two) times daily as needed for muscle spasms. (Patient not taking: Reported on 02/29/2016) 20 tablet 0 Not Taking  . diphenhydrAMINE (BENADRYL) 25 mg capsule Take 1 capsule (25 mg total) by mouth every 6 (six) hours as needed. (Patient not taking: Reported on 02/29/2016) 30 capsule 0 Not Taking  . HYDROcodone-acetaminophen (NORCO) 5-325 MG per tablet Take 1-2 tablets by mouth every 6 (six) hours as needed (for pain). (Patient not taking: Reported on 02/29/2016)  6 tablet 0 Not Taking  . metroNIDAZOLE (FLAGYL) 500 MG tablet Take 1 tablet (500 mg total) by mouth 2 (two) times daily. (Patient not taking: Reported on 02/29/2016) 14 tablet 0 Not Taking  . metroNIDAZOLE (METROGEL) 0.75 % vaginal gel Place 1 Applicatorful vaginally 2 (two) times daily. Apply two times daily for 7 days (Patient not taking: Reported on 02/29/2016) 70 g 0 Not Taking  . naproxen (NAPROSYN) 500 MG tablet Take 1 tablet (500 mg total) by mouth 2 (two) times daily. (Patient not taking: Reported on 02/29/2016) 30 tablet 0 Not Taking  . sulfamethoxazole-trimethoprim (SEPTRA DS) 800-160 MG per tablet Take 1 tablet by mouth every 12 (twelve) hours. (Patient not taking: Reported on 02/29/2016) 14 tablet 0 Not Taking     Review of Systems   All systems reviewed and negative except as stated in HPI  Blood pressure 133/74, pulse 84, temperature 98.3 F (36.8 C), temperature source Oral, resp. rate 20, height 5\' 1"  (1.549 m), weight 66.7 kg (147 lb), last menstrual period 12/02/2015. General appearance: alert and cooperative Lungs: clear to auscultation bilaterally Heart: regular rate and rhythm Abdomen: soft, non-tender; bowel sounds normal Extremities: No calf swelling or tenderness Presentation: cephalic Fetal monitoring: Baseline 140s, mod variability, pos accels, no decels, contractions q252min Uterine activity:  Dilation: 1.5 Effacement (%): Thick Station: -3 Exam by::  Warden   Prenatal labs: ABO, Rh: --/--/O POS (01/29 1610) Antibody: PENDING (01/29 1632) Rubella: Immune RPR: Nonreactive (07/16 0000)  HBsAg: Negative (07/16 0000)  HIV: Non-reactive (07/16 0000)  GBS: Positive (01/03 0000)  1 hr Glucola: 150, 3hr reportedly normal per patient (record not available from health department Genetic screening: normal Anatomy US: normal  Prenatal Transfer Tool  Maternal Diabetes: No Genetic Screening: Normal Maternal Ultrasounds/Referrals: Normal Fetal Ultrasounds or other  Referrals:  None Maternal Substance Abuse:  No Significant Maternal Medications:  None Significant Maternal Lab Results: unknown GBS status  Results for orders placed or performed during the hospital encounter of 09/09/16 (from the past 24 hour(s))  Fern Test   Collection Time: 09/09/16  4:12 PM  Result Value Ref Range   POCT Fern Test Positive = ruptured amniotic membanes   Type and screen [facility] [place]   Collection Time: 09/09/16  4:32 PM  Result Value Ref Range   ABO/RH(D) O POS    Antibody Screen PENDING    Sample Expiration 09/12/2016   CBC   Collection Time: 09/09/16  4:35 PM  Result Value Ref Range   WBC 7.0 4.0 - 10.5 K/uL   RBC 3.93 3.87 - 5.11 MIL/uL   Hemoglobin 10.8 (L) 12.0 - 15.0 g/dL   HCT 96.0 (L) 45.4 - 09.8 %   MCV 84.5 78.0 - 100.0 fL   MCH 27.5 26.0 - 34.0 pg   MCHC 32.5 30.0 - 36.0 g/dL   RDW 11.9 14.7 - 82.9 %   Platelets 120 (L) 150 - 400 K/uL    Patient Active Problem List   Diagnosis Date Noted  . Indication for care in labor or delivery 09/09/2016    Assessment: Brianna Grant is a 26 y.o. G2P1001 at [redacted]w[redacted]d here for IOL after SROM at 1500.  Positive for GBS.   #Labor: foley bulb placed will start pit when bulb falls out, continue expectant management and anticipate SVD #Pain: epidural #FWB:  category I #ID: GBS pos(started amp 2g, then PCN 2.55million units q4hrs) #MOF: bottle #MOC: depo #Circ:  outpatient  Renne Musca, MD PGY-1 09/09/2016, 5:11 PM  The patient was seen and examined by me also Agree with note NST reactive and reassuring UCs as listed Cervical exams as listed in note  Aviva Signs, CNM

## 2016-09-09 NOTE — Anesthesia Pain Management Evaluation Note (Signed)
  CRNA Pain Management Visit Note  Patient: Brianna Grant, 26 y.o., female  "Hello I am a [facility]. We have an anesthesia team available at all times to provide care throughout the hospital, including epidural management and anesthesia for C-section. I don't know your plan for the delivery whether it a natural birth, water birth, IV sedation, nitrous supplementation, doula or epidural, but we want to meet your pain goals."   1.Was your pain managed to your expectations on prior hospitalizations?   Yes   2.What is your expectation for pain management during this hospitalization?     Epidural  3.How can we help you reach that goal? Epidural when I'm ready.  Record the patient's initial score and the patient's pain goal.   Pain: 5  Pain Goal: 8 [facility] wants you to be able to say your pain was always managed very well.  [name] 09/09/2016

## 2016-09-10 ENCOUNTER — Inpatient Hospital Stay (HOSPITAL_COMMUNITY): Payer: Medicaid Other | Admitting: Anesthesiology

## 2016-09-10 ENCOUNTER — Encounter (HOSPITAL_COMMUNITY)
Admission: AD | Disposition: A | Discharge status: Home / Self Care | Payer: Self-pay | Source: Ambulatory Visit | Attending: Family Medicine

## 2016-09-10 DIAGNOSIS — O99019 Anemia complicating pregnancy, unspecified trimester: Secondary | ICD-10-CM

## 2016-09-10 DIAGNOSIS — O99820 Streptococcus B carrier state complicating pregnancy: Secondary | ICD-10-CM

## 2016-09-10 DIAGNOSIS — Z3A40 40 weeks gestation of pregnancy: Secondary | ICD-10-CM

## 2016-09-10 DIAGNOSIS — O323XX0 Maternal care for face, brow and chin presentation, not applicable or unspecified: Secondary | ICD-10-CM

## 2016-09-10 LAB — PREPARE RBC (CROSSMATCH)

## 2016-09-10 LAB — CBC
HCT: 28.5 % — ABNORMAL LOW (ref 36.0–46.0)
HEMATOCRIT: 27.4 % — AB (ref 36.0–46.0)
HEMOGLOBIN: 9.1 g/dL — AB (ref 12.0–15.0)
Hemoglobin: 9.4 g/dL — ABNORMAL LOW (ref 12.0–15.0)
MCH: 28.1 pg (ref 26.0–34.0)
MCH: 27.9 pg (ref 26.0–34.0)
MCHC: 33 g/dL (ref 30.0–36.0)
MCHC: 33.2 g/dL (ref 30.0–36.0)
MCV: 85.1 fL (ref 78.0–100.0)
MCV: 84 fL (ref 78.0–100.0)
PLATELETS: 103 10*3/uL — AB (ref 150–400)
Platelets: 111 10*3/uL — ABNORMAL LOW (ref 150–400)
RBC: 3.35 MIL/uL — ABNORMAL LOW (ref 3.87–5.11)
RBC: 3.26 MIL/uL — AB (ref 3.87–5.11)
RDW: 14.5 % (ref 11.5–15.5)
RDW: 14.6 % (ref 11.5–15.5)
WBC: 10.5 10*3/uL (ref 4.0–10.5)
WBC: 9.9 10*3/uL (ref 4.0–10.5)

## 2016-09-10 LAB — RPR: RPR: NONREACTIVE

## 2016-09-10 SURGERY — Surgical Case
Anesthesia: Epidural

## 2016-09-10 MED ORDER — OXYCODONE HCL 5 MG PO TABS
10.0000 mg | ORAL_TABLET | ORAL | Status: DC | PRN
  Administered 2016-09-11 – 2016-09-12 (×3): 10 mg via ORAL
  Filled 2016-09-10 (×3): qty 2

## 2016-09-10 MED ORDER — DIPHENHYDRAMINE HCL 25 MG PO CAPS
25.0000 mg | ORAL_CAPSULE | Freq: Four times a day (QID) | ORAL | Status: DC | PRN
Start: 2016-09-10 — End: 2016-09-12
  Administered 2016-09-11: 25 mg via ORAL
  Filled 2016-09-10: qty 1

## 2016-09-10 MED ORDER — ACETAMINOPHEN 325 MG PO TABS
650.0000 mg | ORAL_TABLET | ORAL | Status: DC | PRN
  Administered 2016-09-11: 650 mg via ORAL
  Filled 2016-09-10: qty 2

## 2016-09-10 MED ORDER — ONDANSETRON HCL 4 MG/2ML IJ SOLN
INTRAMUSCULAR | Status: DC | PRN
  Administered 2016-09-10: 4 mg via INTRAVENOUS

## 2016-09-10 MED ORDER — SIMETHICONE 80 MG PO CHEW
80.0000 mg | CHEWABLE_TABLET | ORAL | Status: DC
  Administered 2016-09-10: 80 mg via ORAL
  Filled 2016-09-10 (×2): qty 1

## 2016-09-10 MED ORDER — ONDANSETRON HCL 4 MG/2ML IJ SOLN
INTRAMUSCULAR | Status: AC
  Filled 2016-09-10: qty 2

## 2016-09-10 MED ORDER — FENTANYL CITRATE (PF) 100 MCG/2ML IJ SOLN
INTRAMUSCULAR | Status: DC | PRN
  Administered 2016-09-10: 100 ug via EPIDURAL
  Administered 2016-09-10: 100 ug via INTRAVENOUS

## 2016-09-10 MED ORDER — INFLUENZA VAC SPLIT QUAD 0.5 ML IM SUSY: 0.5000 mL | PREFILLED_SYRINGE | INTRAMUSCULAR | Status: DC

## 2016-09-10 MED ORDER — SENNOSIDES-DOCUSATE SODIUM 8.6-50 MG PO TABS
2.0000 | ORAL_TABLET | ORAL | Status: DC
  Administered 2016-09-10 – 2016-09-12 (×2): 2 via ORAL
  Filled 2016-09-10 (×2): qty 2

## 2016-09-10 MED ORDER — IBUPROFEN 600 MG PO TABS
600.0000 mg | ORAL_TABLET | Freq: Four times a day (QID) | ORAL | Status: DC
  Administered 2016-09-10 – 2016-09-12 (×7): 600 mg via ORAL
  Filled 2016-09-10 (×7): qty 1

## 2016-09-10 MED ORDER — FENTANYL CITRATE (PF) 100 MCG/2ML IJ SOLN
INTRAMUSCULAR | Status: AC
  Filled 2016-09-10: qty 2

## 2016-09-10 MED ORDER — DEXTROSE 5 % IV SOLN
500.0000 mg | Freq: Once | INTRAVENOUS | Status: AC
  Administered 2016-09-10: 500 mg via INTRAVENOUS
  Filled 2016-09-10: qty 500

## 2016-09-10 MED ORDER — CHLOROPROCAINE HCL (PF) 3 % IJ SOLN
INTRAMUSCULAR | Status: AC
  Filled 2016-09-10: qty 20

## 2016-09-10 MED ORDER — COCONUT OIL OIL: 1.0000 "application " | TOPICAL_OIL | Status: DC | PRN

## 2016-09-10 MED ORDER — MEPERIDINE HCL 25 MG/ML IJ SOLN: 6.2500 mg | INTRAMUSCULAR | Status: DC | PRN

## 2016-09-10 MED ORDER — HYDROMORPHONE HCL 1 MG/ML IJ SOLN
0.2500 mg | INTRAMUSCULAR | Status: DC | PRN
  Administered 2016-09-10 (×3): 0.5 mg via INTRAVENOUS

## 2016-09-10 MED ORDER — CHLOROPROCAINE HCL (PF) 3 % IJ SOLN
INTRAMUSCULAR | Status: DC | PRN
  Administered 2016-09-10: 20 mL

## 2016-09-10 MED ORDER — PHENYLEPHRINE 40 MCG/ML (10ML) SYRINGE FOR IV PUSH (FOR BLOOD PRESSURE SUPPORT)
PREFILLED_SYRINGE | INTRAVENOUS | Status: AC
  Filled 2016-09-10: qty 10

## 2016-09-10 MED ORDER — LACTATED RINGERS IV SOLN
INTRAVENOUS | Status: DC | PRN
  Administered 2016-09-10 (×2): via INTRAVENOUS

## 2016-09-10 MED ORDER — MEPERIDINE HCL 25 MG/ML IJ SOLN
INTRAMUSCULAR | Status: DC | PRN
  Administered 2016-09-10 (×2): 12.5 mg via INTRAVENOUS

## 2016-09-10 MED ORDER — OXYTOCIN 40 UNITS IN LACTATED RINGERS INFUSION - SIMPLE MED: 2.5000 [IU]/h | INTRAVENOUS | Status: AC

## 2016-09-10 MED ORDER — DIBUCAINE 1 % RE OINT: 1.0000 "application " | TOPICAL_OINTMENT | RECTAL | Status: DC | PRN

## 2016-09-10 MED ORDER — HYDROMORPHONE HCL 1 MG/ML IJ SOLN
INTRAMUSCULAR | Status: AC
  Filled 2016-09-10: qty 1

## 2016-09-10 MED ORDER — SIMETHICONE 80 MG PO CHEW: 80.0000 mg | CHEWABLE_TABLET | ORAL | Status: DC | PRN

## 2016-09-10 MED ORDER — TERBUTALINE SULFATE 1 MG/ML IJ SOLN: 0.2500 mg | Freq: Once | INTRAMUSCULAR | Status: DC | PRN

## 2016-09-10 MED ORDER — CEFAZOLIN SODIUM-DEXTROSE 2-3 GM-% IV SOLR
INTRAVENOUS | Status: DC | PRN
  Administered 2016-09-10: 2 g via INTRAVENOUS

## 2016-09-10 MED ORDER — LIDOCAINE-EPINEPHRINE (PF) 2 %-1:200000 IJ SOLN
INTRAMUSCULAR | Status: AC
  Filled 2016-09-10: qty 20

## 2016-09-10 MED ORDER — MENTHOL 3 MG MT LOZG: 1.0000 | LOZENGE | OROMUCOSAL | Status: DC | PRN

## 2016-09-10 MED ORDER — LIDOCAINE-EPINEPHRINE (PF) 2 %-1:200000 IJ SOLN
INTRAMUSCULAR | Status: DC | PRN
  Administered 2016-09-10: 5 mL via INTRADERMAL
  Administered 2016-09-10: 4 mL

## 2016-09-10 MED ORDER — LIDOCAINE HCL (PF) 1 % IJ SOLN
INTRAMUSCULAR | Status: DC | PRN
  Administered 2016-09-10 (×2): 5 mL

## 2016-09-10 MED ORDER — SIMETHICONE 80 MG PO CHEW
80.0000 mg | CHEWABLE_TABLET | Freq: Three times a day (TID) | ORAL | Status: DC
  Administered 2016-09-11 – 2016-09-12 (×3): 80 mg via ORAL
  Filled 2016-09-10 (×4): qty 1

## 2016-09-10 MED ORDER — BUPIVACAINE HCL (PF) 0.25 % IJ SOLN
INTRAMUSCULAR | Status: AC
  Filled 2016-09-10: qty 10

## 2016-09-10 MED ORDER — MEPERIDINE HCL 25 MG/ML IJ SOLN
INTRAMUSCULAR | Status: AC
  Filled 2016-09-10: qty 1

## 2016-09-10 MED ORDER — OXYTOCIN 40 UNITS IN LACTATED RINGERS INFUSION - SIMPLE MED
1.0000 m[IU]/min | INTRAVENOUS | Status: DC
  Administered 2016-09-10: 2 m[IU]/min via INTRAVENOUS

## 2016-09-10 MED ORDER — LACTATED RINGERS IV SOLN
INTRAVENOUS | Status: DC
  Administered 2016-09-11: 01:00:00 via INTRAVENOUS

## 2016-09-10 MED ORDER — SODIUM BICARBONATE 8.4 % IV SOLN
INTRAVENOUS | Status: DC | PRN
  Administered 2016-09-10 (×5): 5 mL via EPIDURAL
  Administered 2016-09-10: 4 mL via EPIDURAL

## 2016-09-10 MED ORDER — TETANUS-DIPHTH-ACELL PERTUSSIS 5-2.5-18.5 LF-MCG/0.5 IM SUSP
0.5000 mL | Freq: Once | INTRAMUSCULAR | Status: DC
Start: 2016-09-11 — End: 2016-09-12

## 2016-09-10 MED ORDER — ZOLPIDEM TARTRATE 5 MG PO TABS: 5.0000 mg | ORAL_TABLET | Freq: Every evening | ORAL | Status: DC | PRN

## 2016-09-10 MED ORDER — SODIUM BICARBONATE 8.4 % IV SOLN
INTRAVENOUS | Status: AC
Start: 2016-09-10 — End: 2016-09-10
  Filled 2016-09-10: qty 50

## 2016-09-10 MED ORDER — LACTATED RINGERS IV SOLN
INTRAVENOUS | Status: DC
  Administered 2016-09-10: 09:00:00 via INTRAUTERINE

## 2016-09-10 MED ORDER — CEFAZOLIN SODIUM-DEXTROSE 2-4 GM/100ML-% IV SOLN
INTRAVENOUS | Status: AC
Start: 2016-09-10 — End: 2016-09-10
  Filled 2016-09-10: qty 100

## 2016-09-10 MED ORDER — SODIUM CHLORIDE 0.9 % IR SOLN
Status: DC | PRN
  Administered 2016-09-10: 1

## 2016-09-10 MED ORDER — PRENATAL MULTIVITAMIN CH
1.0000 | ORAL_TABLET | Freq: Every day | ORAL | Status: DC
  Administered 2016-09-11 – 2016-09-12 (×2): 1 via ORAL
  Filled 2016-09-10 (×2): qty 1

## 2016-09-10 MED ORDER — MORPHINE SULFATE (PF) 0.5 MG/ML IJ SOLN
INTRAMUSCULAR | Status: DC | PRN
  Administered 2016-09-10: 4 mg via EPIDURAL

## 2016-09-10 MED ORDER — OXYTOCIN 10 UNIT/ML IJ SOLN
INTRAMUSCULAR | Status: AC
  Filled 2016-09-10: qty 4

## 2016-09-10 MED ORDER — SODIUM CHLORIDE 0.9 % IV SOLN: Freq: Once | INTRAVENOUS | Status: DC

## 2016-09-10 MED ORDER — SCOPOLAMINE 1 MG/3DAYS TD PT72
MEDICATED_PATCH | TRANSDERMAL | Status: DC | PRN
  Administered 2016-09-10: 1 via TRANSDERMAL

## 2016-09-10 MED ORDER — LACTATED RINGERS IV SOLN
INTRAVENOUS | Status: DC | PRN
  Administered 2016-09-10: 19:00:00 via INTRAVENOUS

## 2016-09-10 MED ORDER — OXYCODONE HCL 5 MG PO TABS
5.0000 mg | ORAL_TABLET | ORAL | Status: DC | PRN
  Administered 2016-09-11: 5 mg via ORAL
  Filled 2016-09-10: qty 1

## 2016-09-10 MED ORDER — SCOPOLAMINE 1 MG/3DAYS TD PT72
MEDICATED_PATCH | TRANSDERMAL | Status: AC
  Filled 2016-09-10: qty 1

## 2016-09-10 MED ORDER — MORPHINE SULFATE (PF) 0.5 MG/ML IJ SOLN
INTRAMUSCULAR | Status: AC
  Filled 2016-09-10: qty 10

## 2016-09-10 MED ORDER — WITCH HAZEL-GLYCERIN EX PADS: 1.0000 "application " | MEDICATED_PAD | CUTANEOUS | Status: DC | PRN

## 2016-09-10 MED ORDER — OXYTOCIN 10 UNIT/ML IJ SOLN
INTRAVENOUS | Status: DC | PRN
  Administered 2016-09-10: 40 [IU] via INTRAVENOUS

## 2016-09-10 SURGICAL SUPPLY — 36 items
ADH SKN CLS APL DERMABOND .7 (GAUZE/BANDAGES/DRESSINGS) ×1
APL SKNCLS STERI-STRIP NONHPOA (GAUZE/BANDAGES/DRESSINGS) ×1
BENZOIN TINCTURE PRP APPL 2/3 (GAUZE/BANDAGES/DRESSINGS) ×3 IMPLANT
CANISTER SUCT 3000ML PPV (MISCELLANEOUS) ×3 IMPLANT
CHLORAPREP W/TINT 26ML (MISCELLANEOUS) ×3 IMPLANT
CLOSURE WOUND 1/2 X4 (GAUZE/BANDAGES/DRESSINGS) ×1
DERMABOND ADVANCED (GAUZE/BANDAGES/DRESSINGS) ×2
DERMABOND ADVANCED .7 DNX12 (GAUZE/BANDAGES/DRESSINGS) ×1 IMPLANT
DRSG OPSITE POSTOP 4X10 (GAUZE/BANDAGES/DRESSINGS) ×3 IMPLANT
ELECT REM PT RETURN 9FT ADLT (ELECTROSURGICAL) ×3
ELECTRODE REM PT RTRN 9FT ADLT (ELECTROSURGICAL) ×1 IMPLANT
GLOVE BIOGEL PI IND STRL 7.0 (GLOVE) ×2 IMPLANT
GLOVE BIOGEL PI IND STRL 7.5 (GLOVE) ×1 IMPLANT
GLOVE BIOGEL PI INDICATOR 7.0 (GLOVE) ×4
GLOVE BIOGEL PI INDICATOR 7.5 (GLOVE) ×2
GLOVE SKINSENSE NS SZ7.0 (GLOVE) ×2
GLOVE SKINSENSE STRL SZ7.0 (GLOVE) ×1 IMPLANT
GOWN STRL REUS W/ TWL LRG LVL3 (GOWN DISPOSABLE) ×2 IMPLANT
GOWN STRL REUS W/ TWL XL LVL3 (GOWN DISPOSABLE) ×1 IMPLANT
GOWN STRL REUS W/TWL LRG LVL3 (GOWN DISPOSABLE) ×6
GOWN STRL REUS W/TWL XL LVL3 (GOWN DISPOSABLE) ×3
NS IRRIG 1000ML POUR BTL (IV SOLUTION) ×3 IMPLANT
PACK C SECTION WH (CUSTOM PROCEDURE TRAY) ×3 IMPLANT
PAD ABD 7.5X8 STRL (GAUZE/BANDAGES/DRESSINGS) ×3 IMPLANT
PAD OB MATERNITY 4.3X12.25 (PERSONAL CARE ITEMS) ×3 IMPLANT
PAD PREP 24X48 CUFFED NSTRL (MISCELLANEOUS) ×3 IMPLANT
SPONGE GAUZE 4X4 12PLY STER LF (GAUZE/BANDAGES/DRESSINGS) ×3 IMPLANT
SPONGE LAP 18X18 X RAY DECT (DISPOSABLE) ×3 IMPLANT
STRIP CLOSURE SKIN 1/2X4 (GAUZE/BANDAGES/DRESSINGS) ×2 IMPLANT
SUT MON AB 4-0 PS1 27 (SUTURE) ×3 IMPLANT
SUT MON AB-0 CT1 36 (SUTURE) ×6 IMPLANT
SUT PLAIN 2 0 (SUTURE) ×3
SUT PLAIN ABS 2-0 CT1 27XMFL (SUTURE) ×1 IMPLANT
SUT VIC AB 0 CT1 36 (SUTURE) ×6 IMPLANT
SUT VIC AB 3-0 CT1 27 (SUTURE) ×3
SUT VIC AB 3-0 CT1 TAPERPNT 27 (SUTURE) ×1 IMPLANT

## 2016-09-10 NOTE — Progress Notes (Signed)
OB Note  Came with patient re: labor progress. Patient on 20 of pitocin and contracting regularly on that. IUPC and FSE in place and borderline adequate. Fetus category I with early decels. Most recent SVE by dr Omer Jackmumaw was still at 7/80/-1. AF VS normal and stable  D/w pt that will recheck at 1600 which will be 12 hours on pitocin and 24 hours s/p SROM and 4 hours of checks with the same provider; she was 6cm when they started the pitocin and 7cm at around 9-10am by another provider. I told her that if she's unchanged and next check would recommend c/s for arrest of dilation.   Two units already crossmatched.    Pt amenable to plan  Brianna Grant, Jr MD Attending Center for [facility] ([facility]ulty Practice) 09/10/2016 Time: [phone]

## 2016-09-10 NOTE — Consult Note (Signed)
Neonatology Note:   Attendance at C-section:    I was asked by Dr. Pickens to attend this C/S at term for FTP. The mother is a G2P1001, GBS pos (aIAP) with good prenatal care. Pregnancy complicated by tobacco use, elevated glucola screen (normal 3hr per patient), and question of chorioamnionitis just prior to delivery (temp 100.4F). ROM 29 hours before delivery, fluid clear.  Nuchal.  Infant vigorous with good spontaneous cry and tone. Needed only minimal bulb suctioning. Ap 8/9. Lungs clear to ausc in DR. To CN to care of Pediatrician.    Danielys Madry C. Jessamyn Watterson, MD 

## 2016-09-10 NOTE — Anesthesia Postprocedure Evaluation (Signed)
Anesthesia Post Note  Patient: Brianna Grant  Procedure(s) Performed: Procedure(s) (LRB): CESAREAN SECTION (N/A)  Patient location during evaluation: PACU Anesthesia Type: Epidural Level of consciousness: awake Pain management: satisfactory to patient Vital Signs Assessment: post-procedure vital signs reviewed and stable Respiratory status: spontaneous breathing Cardiovascular status: blood pressure returned to baseline Postop Assessment: no headache and spinal receding Anesthetic complications: no        Last Vitals:  Vitals:   09/10/16 2140 09/10/16 2146  BP: 139/76 139/82  Pulse: 97 89  Resp: (!) 22 20  Temp:  36.6 C    Last Pain:  Vitals:   09/10/16 2146  TempSrc: Oral  PainSc:    Pain Goal:                 Jiles GarterJACKSON,Karel Turpen EDWARD

## 2016-09-10 NOTE — Progress Notes (Signed)
S: Patient seen & examined for progress of labor. Patient comfortable with epidural.  Does complain of burning pain with IV penicillin.  Penicillin infusion rate already decreased to help with pain.   O:  Vitals:   09/10/16 0400 09/10/16 0415 09/10/16 0430 09/10/16 0500  BP: (!) 127/39 (!) 115/59 122/70 (!) 112/51  Pulse: 88 80 100 78  Resp:      Temp:  97.9 F (36.6 C)    TempSrc:  Oral    SpO2:      Weight:      Height:        Dilation: 6 Effacement (%): 70 Cervical Position: Middle Station: 0 Presentation: Vertex Exam by:: Laural RoesB. Parks, RN   FHT: 130s bpm, mod var, +accels, some variable decels TOCO: q 3-4 min   A/P:  Currently on pitocin 6 milliunit/min Continue expectant management Anticipate SVD   Satira AnisSung W Park, Medical Student PGY-2 09/10/2016 6:24 AM   OB FELLOW MEDICAL STUDENT NOTE ATTESTATION  I have seen and examined this patient. Note this is a Psychologist, occupationalmedical student note and as such does not necessarily reflect the patient's plan of care. Please see Ernestina Pennaicholas Schenk MD's note for this date of service.    Ernestina Pennaicholas Schenk 09/10/2016, 8:06 AM

## 2016-09-10 NOTE — Anesthesia Preprocedure Evaluation (Addendum)
Anesthesia Evaluation  Patient identified by MRN, date of birth, ID band Patient awake    Reviewed: Allergy & Precautions, H&P , NPO status , Patient's Chart, lab work & pertinent test results  History of Anesthesia Complications Negative for: history of anesthetic complications  Airway Mallampati: II  TM Distance: >3 FB Neck ROM: full    Dental no notable dental hx. (+) Teeth Intact   Pulmonary neg pulmonary ROS, Current Smoker,    Pulmonary exam normal breath sounds clear to auscultation       Cardiovascular negative cardio ROS Normal cardiovascular exam Rhythm:regular Rate:Normal     Neuro/Psych negative neurological ROS  negative psych ROS   GI/Hepatic negative GI ROS, Neg liver ROS,   Endo/Other  negative endocrine ROS  Renal/GU negative Renal ROS  negative genitourinary   Musculoskeletal   Abdominal   Peds  Hematology negative hematology ROS (+)   Anesthesia Other Findings   Reproductive/Obstetrics (+) Pregnancy                             Anesthesia Physical Anesthesia Plan  ASA: II and emergent  Anesthesia Plan: Epidural   Post-op Pain Management:    Induction:   Airway Management Planned: Natural Airway  Additional Equipment:   Intra-op Plan:   Post-operative Plan:   Informed Consent: I have reviewed the patients History and Physical, chart, labs and discussed the procedure including the risks, benefits and alternatives for the proposed anesthesia with the patient or authorized representative who has indicated his/her understanding and acceptance.     Plan Discussed with: Anesthesiologist, CRNA and Surgeon  Anesthesia Plan Comments: (C/section for arrest of dilation. Will use epidural for C/section.)       Anesthesia Quick Evaluation

## 2016-09-10 NOTE — Progress Notes (Addendum)
Patient ID: Brianna Grant, female   DOB: 1991-02-25, 26 y.o.   MRN: [id number]  S: Patient seen & examined for progress of labor. Patient comfortable with epidural. Discussed with the patient regarding prolonged ROM and failure to dilate, and that recommend to perform cesarean section. Patient was in agreement, consent for procedure performed and signed, in the chart.     O:  Vitals:   09/10/16 1455 09/10/16 1500 09/10/16 1530 09/10/16 1600  BP: 120/67 123/65 125/70 128/73  Pulse: (!) 103 94 (!) 102 98  Resp: 18  20 18   Temp:   100.2 F (37.9 C)   TempSrc:   Oral   SpO2:      Weight:      Height:        Dilation: 7 Effacement (%): 80 Cervical Position: Middle Station: -1 Presentation: Vertex Exam by:: Morrisa Aldaba   FHT: 150 bpm, mod var, no accels, early decels IUPC: Adequate   A/P:  FAILURE TO DILATE AND PROLONGED ROM (>24 hours) Stop pitocin Start IV 500mg  Azithro x1 Cesarean section to be performed Consent for operation in chart Dr. Vergie LivingPickens at bedside to consent and explain as well.

## 2016-09-10 NOTE — Progress Notes (Signed)
OB Interim Progress Note  S: Patient seen and evaluated for progress of labor.  Resting comfortably without pain. Recurrent variable decels (not prolonged).   O: BP 128/71   Pulse (!) 147   Temp 97.9 F (36.6 C) (Oral)   Resp 18   Ht 5\' 1"  (1.549 m)   Wt 66.7 kg (147 lb)   LMP 12/02/2015 (Exact Date)   SpO2 99%   BMI 27.78 kg/m    Dilation: 6 Effacement (%): 80 Cervical Position: Middle Station: -1 Presentation: Vertex Exam by:: Stirling Orton  FHR: HR baseline 145, mod variability, pos accels, variable decels, contractions q2-3 min  A/P: Patient comfortable with epidural and continues with variable decels. Placed IUPC without complication and started amnioinfusion 250cc bolus and 150cc/hr. Continue with pencillin G q4hrs. Will continue to closely monitor.  Continue pitocin.  Continue expectant management Anticipate SVD  Renne Muscaaniel L Raekwon Winkowski, MD 09/10/2016, 9:38 AM PGY-1

## 2016-09-10 NOTE — Op Note (Addendum)
Operative Note   SURGERY DATE: 09/10/2016  PRE-OP DIAGNOSIS:  Intrauterine pregnancy @ 40 3/7 weeks Failure to dilate  POST-OP DIAGNOSIS: Same and brow presentation   PROCEDURE: Primary low transverse cesarean section via pfannenstiel skin incision with double layer uterine closure  SURGEON: Surgeon(s) and Role:    * [place] Bingharlie Jong Rickman, MD - Primary    * Michaele OfferElizabeth Woodland Mumaw, DO - Fellow  ASSISTANT: Jen MowElizabeth Mumaw, DO  ANESTHESIA: epidural  ESTIMATED BLOOD LOSS: 700 mL  DRAINS: 450 mL UOP via indwelling foley  TOTAL IV FLUIDS: 1500 mL crystalloid  VTE Prophylaxis: SCDs  ANTIBIOTICS: Two grams of Cefazolin and Azithromycin 500mg  IV was given within 1 hour of skin incision  SPECIMENS: None  COMPLICATIONS: None  INDICATIONS: Failure to dilate  FINDINGS: No intra-abdominal adhesions were noted. Grossly normal uterus, tubes and ovaries. Clear amniotic fluid, BROW presentation female infant, weight 3215g, APGARs 8/9, intact placenta. Pelvis felt adequate, without narrow arch.   PROCEDURE IN DETAIL: The patient was taken to the operating room where anesthesia was administered and normal fetal heart tones were confirmed. She was then prepped and draped in the normal fashion in the dorsal supine position with a leftward tilt.  After a time out was performed, a pfannensteil skin incision was made with the scalpel and carried through to the underlying layer of fascia. The fascia was then incised at the midline and this incision was extended laterally with the mayo scissors. Attention was turned to the superior aspect of the fascial incision which was grasped with the kocher clamps x 2, tented up and the rectus muscles were dissected off with mayo scissors. In a similar fashion the inferior aspect of the fascial incision was grasped with the kocher clamps, tented up and the rectus muscles dissected off with the mayo scissors. The rectus muscles were then separated in the midline and the  peritoneum was entered bluntly. The bladder blade was inserted and the vesicouterine peritoneum was identified, tented up and entered with the metzenbaum scissors. This incision was extended laterally and the bladder flap was created digitally. The bladder blade was reinserted.  A low transverse hysterotomy was made with the scalpel until the endometrial cavity was breached and the amniotic sac ruptured bluntly, yielding clear amniotic fluid. This incision was extended bluntly and the infant's head, shoulders and body were delivered atraumatically.The cord was clamped x 2 and cut, and the infant was handed to the awaiting pediatricians, after delayed cord clamping was done.  The placenta was then gradually expressed from the uterus and then the uterus was exteriorized and cleared of all clots and debris. The hysterotomy was repaired with a running suture of 0 Vicryl. A second imbricating layer of 0 Vicryl suture was then placed. Excellent hemostasis was noted. 30cc Nesicaine was added inside the peritoneal cavity requested by anesthesia.   The uterus and adnexa were then returned to the abdomen, and the hysterotomy and all operative sites were reinspected and excellent hemostasis was noted after irrigation and suction of the abdomen with warm saline.  The peritoneum was closed with a running stitch of 1-0 Vicryl. The fascia was reapproximated with 0 Monocryl in a simple running fashion. The subcutaneous layer was then reapproximated with interrupted sutures of 2-0 plain gut, and the skin was then closed with 4-0 monocryl, in a subcuticular fashion.  The patient  tolerated the procedure well. Sponge, lap, needle, and instrument counts were correct x 2. The patient was transferred to the recovery room awake, alert and breathing  independently in stable condition.    Cleda Clarks, DO OB Fellow Center for [facility])  Agree with above. I was present and scrubbed for the  entire procedure.   Cornelia Copa MD Attending Center for [facility])

## 2016-09-10 NOTE — Progress Notes (Signed)
Patient ID: Brianna Grant, female   DOB: [birth year], 26 y.o.   MRN: [id number]  S: Patient seen & examined for progress of labor. Patient comfortable with epidural. Discussed with patient ready to go to OR for cesarean, patient is ready.     O:  Vitals:   09/10/16 1630 09/10/16 1700 09/10/16 1730 09/10/16 1800  BP: 138/61 126/73 129/70 126/72  Pulse: 98 (!) 102 99 (!) 101  Resp:  18 20 20   Temp:   (!) 100.4 F (38 C)   TempSrc:   Oral   SpO2:      Weight:      Height:        Dilation: 7 Effacement (%): 80 Cervical Position: Middle Station: -1 Presentation: Vertex Exam by:: mumaw  Still no change in cervix.   FHT: 140 bpm, mod var, +accels, no decels IUPC: not adequate, pitocin has been turned off  A/P: Failure to dilate - crossmatched 4 units PRBC - FFP ready if needed - IV Azithro 500mg  given - 2g Ancef to be given before starting - Consent signed - OR team ready - Anesthesia is ready

## 2016-09-10 NOTE — Transfer of Care (Signed)
Immediate Anesthesia Transfer of Care Note  Patient: Brianna Grant  Procedure(s) Performed: Procedure(s): CESAREAN SECTION (N/A)  Patient Location: PACU  Anesthesia Type:Epidural  Level of Consciousness: awake  Airway & Oxygen Therapy: Patient Spontanous Breathing  Post-op Assessment: Report given to RN and Post -op Vital signs reviewed and stable  Post vital signs: stable  Last Vitals:  Vitals:   09/10/16 1838 09/10/16 2020  BP: (!) 141/113   Pulse:    Resp: 20 (P) 20  Temp:  (P) 37.2 C    Last Pain:  Vitals:   09/10/16 2020  TempSrc: (P) Oral  PainSc:          Complications: No apparent anesthesia complications

## 2016-09-10 NOTE — Progress Notes (Signed)
S: Patient seen & examined for progress of labor. Patient has had foley bulb removed. She is starting to feel contractions more and is receiving fluid bolus for epidural placement.     O:  Vitals:   09/09/16 2137 09/09/16 2139 09/09/16 2140 09/09/16 2335  BP:   120/65   Pulse:   79   Resp:  18    Temp: 98.2 F (36.8 C)   98.2 F (36.8 C)  TempSrc: Oral   Oral  Weight:      Height:        Dilation: 4.5 Effacement (%): 50 Cervical Position: Middle Station: -2 Presentation: Vertex Exam by:: Laural RoesB. Parks, RN   FHT: 140s bpm, mod var, +accels, no decels TOCO: q3-384min   A/P: Foley bulb is out Epidural is being placed at this time Will consider starting pitocin afterwards pending progress Continue expectant management Anticipate SVD   Gorden HarmsMegan Sy Saintjean, MD PGY-2 09/10/2016 1:26 AM

## 2016-09-10 NOTE — Progress Notes (Signed)
Patient ID: Brianna Grant, female   DOB: 09/24/1990, 26 y.o.   MRN: [id number]  S: Patient seen & examined for progress of labor. Patient comfortable with epidural. Patient has been having mildly elevating temperatures, never had a true temperature, she feels fine and states does not feel feverish.    O:  Vitals:   09/10/16 1220 09/10/16 1226 09/10/16 1230 09/10/16 1235  BP: 118/67 134/66 128/69 (!) 146/72  Pulse: 100 100 96 (!) 105  Resp: 20   20  Temp:  100 F (37.8 C)    TempSrc:  Oral    SpO2:      Weight:      Height:        Dilation: 7 Effacement (%): 80 Cervical Position: Middle Station: -1 Presentation: Vertex Exam by:: Mumaw Baby feels ROA position on exam  FHT: 150 bpm, mod var, +accels, variable decels during contractions IUPC: 200 MVU, pit at 16 milliunits. She waxes and wanes between adequate and near adequate    A/P: Continue increasing pitocin Watching closely for progress of labor, will recheck in 90 minutes Monitor Temperatures closely Continue expectant management Anticipate SVD

## 2016-09-10 NOTE — Progress Notes (Signed)
Patient ID: Brianna Grant, female   DOB: [birth year], 26 y.o.   MRN: [id number]  S: Patient seen & examined for progress of labor. Patient comfortable with epidural. Not feeling feverish. Feeling more pressure in the vagina during contractions.    O:  Vitals:   09/10/16 1240 09/10/16 1245 09/10/16 1300 09/10/16 1330  BP: 137/69 122/65 121/61 120/60  Pulse: (!) 116 (!) 118 88 91  Resp:    18  Temp:      TempSrc:      SpO2:      Weight:      Height:        Dilation: 7 Effacement (%): 80 Cervical Position: Middle Station: -1 Presentation: Vertex Exam by:: Mumaw   FHT: 150 bpm, mod var, no accels, early decels IUPC: adequate   A/P: Patient has not made further change, is about 23 hours ROM (prolonged), will likely proceed with cesarean section, Dr. Vergie LivingPickens to come speak with patient.

## 2016-09-10 NOTE — Anesthesia Procedure Notes (Signed)
Epidural Patient location during procedure: OB  Staffing Anesthesiologist: Pam Vanalstine Performed: anesthesiologist   Preanesthetic Checklist Completed: patient identified, site marked, surgical consent, pre-op evaluation, timeout performed, IV checked, risks and benefits discussed and monitors and equipment checked  Epidural Patient position: sitting Prep: DuraPrep Patient monitoring: heart rate, continuous pulse ox and blood pressure Approach: right paramedian Location: L3-L4 Injection technique: LOR saline  Needle:  Needle type: Tuohy  Needle gauge: 17 G Needle length: 9 cm and 9 Needle insertion depth: 5 cm Catheter type: closed end flexible Catheter size: 20 Guage Catheter at skin depth: 9 cm Test dose: negative  Assessment Events: blood not aspirated, injection not painful, no injection resistance, negative IV test and no paresthesia  Additional Notes Patient identified. Risks/Benefits/Options discussed with patient including but not limited to bleeding, infection, nerve damage, paralysis, failed block, incomplete pain control, headache, blood pressure changes, nausea, vomiting, reactions to medication both or allergic, itching and postpartum back pain. Confirmed with bedside nurse the patient's most recent platelet count. Confirmed with patient that they are not currently taking any anticoagulation, have any bleeding history or any family history of bleeding disorders. Patient expressed understanding and wished to proceed. All questions were answered. Sterile technique was used throughout the entire procedure. Please see nursing notes for vital signs. Test dose was given through epidural needle and negative prior to continuing to dose epidural or start infusion. Warning signs of high block given to the patient including shortness of breath, tingling/numbness in hands, complete motor block, or any concerning symptoms with instructions to call for help. Patient was given  instructions on fall risk and not to get out of bed. All questions and concerns addressed with instructions to call with any issues.     

## 2016-09-11 ENCOUNTER — Inpatient Hospital Stay (HOSPITAL_COMMUNITY): Payer: No Typology Code available for payment source

## 2016-09-11 ENCOUNTER — Encounter (HOSPITAL_COMMUNITY): Payer: Self-pay | Admitting: Obstetrics and Gynecology

## 2016-09-11 LAB — CBC
HCT: 22.9 % — ABNORMAL LOW (ref 36.0–46.0)
Hemoglobin: 7.6 g/dL — ABNORMAL LOW (ref 12.0–15.0)
MCH: 28.1 pg (ref 26.0–34.0)
MCHC: 33.2 g/dL (ref 30.0–36.0)
MCV: 84.8 fL (ref 78.0–100.0)
PLATELETS: 92 10*3/uL — AB (ref 150–400)
RBC: 2.7 MIL/uL — AB (ref 3.87–5.11)
RDW: 14.6 % (ref 11.5–15.5)
WBC: 8.9 10*3/uL (ref 4.0–10.5)

## 2016-09-11 MED ORDER — NALBUPHINE HCL 10 MG/ML IJ SOLN: 5.0000 mg | Freq: Once | INTRAMUSCULAR | Status: DC | PRN

## 2016-09-11 MED ORDER — NALOXONE HCL 2 MG/2ML IJ SOSY
1.0000 ug/kg/h | PREFILLED_SYRINGE | INTRAVENOUS | Status: DC | PRN
  Filled 2016-09-11: qty 2

## 2016-09-11 MED ORDER — NALOXONE HCL 0.4 MG/ML IJ SOLN: 0.4000 mg | INTRAMUSCULAR | Status: DC | PRN

## 2016-09-11 MED ORDER — NALBUPHINE HCL 10 MG/ML IJ SOLN
5.0000 mg | INTRAMUSCULAR | Status: DC | PRN
  Administered 2016-09-11: 5 mg via SUBCUTANEOUS

## 2016-09-11 MED ORDER — DIPHENHYDRAMINE HCL 25 MG PO CAPS
25.0000 mg | ORAL_CAPSULE | ORAL | Status: DC | PRN
  Administered 2016-09-11: 25 mg via ORAL
  Filled 2016-09-11: qty 1

## 2016-09-11 MED ORDER — DIPHENHYDRAMINE HCL 50 MG/ML IJ SOLN
12.5000 mg | INTRAMUSCULAR | Status: DC | PRN
Start: 2016-09-11 — End: 2016-09-12

## 2016-09-11 MED ORDER — ONDANSETRON HCL 4 MG/2ML IJ SOLN: 4.0000 mg | Freq: Three times a day (TID) | INTRAMUSCULAR | Status: DC | PRN

## 2016-09-11 MED ORDER — SCOPOLAMINE 1 MG/3DAYS TD PT72: 1.0000 | MEDICATED_PATCH | Freq: Once | TRANSDERMAL | Status: DC

## 2016-09-11 MED ORDER — ACETAMINOPHEN 500 MG PO TABS
1000.0000 mg | ORAL_TABLET | Freq: Four times a day (QID) | ORAL | Status: AC
  Administered 2016-09-12: 1000 mg via ORAL
  Filled 2016-09-11 (×2): qty 2

## 2016-09-11 MED ORDER — NALBUPHINE HCL 10 MG/ML IJ SOLN
5.0000 mg | Freq: Once | INTRAMUSCULAR | Status: DC | PRN
  Filled 2016-09-11: qty 1

## 2016-09-11 MED ORDER — SODIUM CHLORIDE 0.9% FLUSH: 3.0000 mL | INTRAVENOUS | Status: DC | PRN

## 2016-09-11 MED ORDER — NALBUPHINE HCL 10 MG/ML IJ SOLN: 5.0000 mg | INTRAMUSCULAR | Status: DC | PRN

## 2016-09-11 NOTE — Progress Notes (Signed)
UR chart review completed.  

## 2016-09-11 NOTE — Progress Notes (Signed)
POSTPARTUM PROGRESS NOTE  Post Op day #1  Subjective:  Brianna Grant is a 26 y.o. G2P1001 4144w4d s/p LTCS.  No acute events overnight.  Pt denies problems with ambulating, voiding or po intake.  She denies nausea or vomiting.  Pain is moderately controlled.  She has had flatus. She has had bowel movement.  Lochia Minimal.   Objective: Blood pressure 110/63, pulse 80, temperature 97.3 F (36.3 C), temperature source Oral, resp. rate 20, height 5\' 1"  (1.549 m), weight 66.7 kg (147 lb), last menstrual period 12/02/2015, SpO2 98 %.  Physical Exam:  General: alert, cooperative and no distress Lochia:normal flow Chest: CTAB Heart: RRR no m/r/g Abdomen: +BS, soft, nontender,  Uterine Fundus: firm DVT Evaluation: No calf swelling or tenderness Extremities: no edema   Recent Labs  09/10/16 2120 09/11/16 0557  HGB 9.1* 7.6*  HCT 27.4* 22.9*    Assessment/Plan:  ASSESSMENT: Brianna Grant is a 26 y.o. G2P1001 544w4d s/p LTCS.  Will remove pressure dressing this afternoon.  Dressing is clean, dry and surrounding area is non-indurated. Pain moderately controlled.   Plan for discharge tomorrow   LOS: 2 days   Renne Muscaaniel L Warden, MD PGY-1 [facility]  09/11/2016, 12:04 PM   Patient was seen and examined by me also Agree with note Vitals stable Labs stable Fundus firm, lochia within normal limits Perineum healing Ext WNL Continue care Anticipate discharge tomorrow   Aviva SignsMarie L Jaileen Janelle, CNM

## 2016-09-11 NOTE — Addendum Note (Signed)
Addendum  created 09/11/16 [id number] by Elgie CongoNataliya H Estalee Mccandlish, CRNA   Sign clinical note

## 2016-09-11 NOTE — Anesthesia Postprocedure Evaluation (Signed)
Anesthesia Post Note  Patient: Brianna Grant  Procedure(s) Performed: Procedure(s) (LRB): CESAREAN SECTION (N/A)  Patient location during evaluation: Mother Baby Anesthesia Type: Epidural Level of consciousness: awake and alert and oriented Pain management: pain level controlled Vital Signs Assessment: post-procedure vital signs reviewed and stable Respiratory status: spontaneous breathing and nonlabored ventilation Postop Assessment: no headache, no backache, epidural receding, patient able to bend at knees, no signs of nausea or vomiting and adequate PO intake Anesthetic complications: no        Last Vitals:  Vitals:   09/11/16 0200 09/11/16 0605  BP: 123/66 110/63  Pulse: 91 80  Resp: 18 20  Temp: 36.8 C 36.3 C    Last Pain:  Vitals:   09/11/16 0605  TempSrc: Oral  PainSc:    Pain Goal:                 Laban EmperorMalinova,Brenisha Tsui Hristova

## 2016-09-12 DIAGNOSIS — O99019 Anemia complicating pregnancy, unspecified trimester: Secondary | ICD-10-CM

## 2016-09-12 DIAGNOSIS — O323XX0 Maternal care for face, brow and chin presentation, not applicable or unspecified: Secondary | ICD-10-CM

## 2016-09-12 DIAGNOSIS — Z3A40 40 weeks gestation of pregnancy: Secondary | ICD-10-CM

## 2016-09-12 DIAGNOSIS — O99820 Streptococcus B carrier state complicating pregnancy: Secondary | ICD-10-CM

## 2016-09-12 LAB — CBC
HCT: 22.1 % — ABNORMAL LOW (ref 36.0–46.0)
HEMOGLOBIN: 7.4 g/dL — AB (ref 12.0–15.0)
MCH: 28.5 pg (ref 26.0–34.0)
MCHC: 33.5 g/dL (ref 30.0–36.0)
MCV: 85 fL (ref 78.0–100.0)
Platelets: 97 10*3/uL — ABNORMAL LOW (ref 150–400)
RBC: 2.6 MIL/uL — AB (ref 3.87–5.11)
RDW: 14.9 % (ref 11.5–15.5)
WBC: 7.3 10*3/uL (ref 4.0–10.5)

## 2016-09-12 MED ORDER — IBUPROFEN 600 MG PO TABS: 600.0000 mg | ORAL_TABLET | Freq: Four times a day (QID) | ORAL | 0 refills | Status: AC | PRN

## 2016-09-12 MED ORDER — OXYCODONE HCL 5 MG PO TABS: 5.0000 mg | ORAL_TABLET | ORAL | 0 refills | Status: AC | PRN

## 2016-09-12 MED ORDER — SENNOSIDES-DOCUSATE SODIUM 8.6-50 MG PO TABS: 2.0000 | ORAL_TABLET | ORAL | 3 refills | Status: AC

## 2016-09-12 MED ORDER — FERROUS FUMARATE 325 (106 FE) MG PO TABS: 2.0000 | ORAL_TABLET | Freq: Every day | ORAL | 3 refills | Status: AC

## 2016-09-12 NOTE — Discharge Summary (Signed)
OB Discharge Summary     Patient Name: Brianna Grant DOB: 1990-09-04 MRN: [id number]  Date of admission: 09/09/2016 Delivering MD: Trappe BingPICKENS, CHARLIE   Date of discharge: 09/12/2016  Admitting diagnosis: 40WKS WATER BROKE Intrauterine pregnancy: 7745w5d     Secondary diagnosis:  Active Problems:   Indication for care in labor or delivery  Additional problems: GBS pos; hx PPH requiring transfusion     Discharge diagnosis: Term Pregnancy Delivered and Anemia                                                                                             Fetal malpresentation, breech.    Post partum procedures:none  Augmentation: Pitocin and Foley Balloon  Complications: ROM>24 hours  Hospital course:  Induction of Labor With Unplanned C/S  26 y.o. yo G2P1001 at 7445w5d was admitted for PROM on 09/09/2016. Patient had a labor course significant for requiring ripening w/ foley bulb, then Pitocin. She reached 7cm and then despite adequate labor, failed to progress. Membrane Rupture Time/Date: 3:00 PM ,09/09/2016   The patient went for cesarean section due to Arrest of Dilation, and delivered a Viable infant,09/10/2016. Infant was found to have a brow presentation at the time of surgery. Details of operation can be found in separate operative note. Patient had an uncomplicated postpartum course.  She is ambulating,tolerating a regular diet, passing flatus, and urinating well.  Patient is discharged home in stable condition 09/12/16.  Physical exam  Vitals:   09/11/16 1030 09/11/16 1410 09/11/16 2015 09/12/16 0600  BP: (!) 113/48 111/60 (!) 128/58 107/60  Pulse: 75 81 88 88  Resp: 18 18 18 20   Temp: 97.8 F (36.6 C) 97.9 F (36.6 C) 97.5 F (36.4 C) 98 F (36.7 C)  TempSrc: Oral Oral Oral Oral  SpO2: 97%   99%  Weight:      Height:       General: alert and cooperative Lochia: appropriate Uterine Fundus: firm Incision: honeycomb intact, dry DVT Evaluation: No evidence of DVT seen on  physical exam. Labs: Hgb on admission: 10.8 Lab Results  Component Value Date   WBC 7.3 09/12/2016   HGB 7.4 (L) 09/12/2016   HCT 22.1 (L) 09/12/2016   MCV 85.0 09/12/2016   PLT 97 (L) 09/12/2016   No flowsheet data found.  Discharge instruction: per After Visit Summary and "Baby and Me Booklet".  After visit meds:  Allergies as of 09/12/2016   No Known Allergies     Medication List    STOP taking these medications   cyclobenzaprine 10 MG tablet Commonly known as:  FLEXERIL   diphenhydrAMINE 25 mg capsule Commonly known as:  BENADRYL   HYDROcodone-acetaminophen 5-325 MG tablet Commonly known as:  NORCO   metroNIDAZOLE 0.75 % vaginal gel Commonly known as:  METROGEL   metroNIDAZOLE 500 MG tablet Commonly known as:  FLAGYL   naproxen 500 MG tablet Commonly known as:  NAPROSYN   sulfamethoxazole-trimethoprim 800-160 MG tablet Commonly known as:  SEPTRA DS     TAKE these medications   ferrous fumarate 325 (106 Fe) MG Tabs tablet Commonly known as:  HEMOCYTE - 106 mg FE Take 2 tablets (212 mg of iron total) by mouth daily.   ibuprofen 600 MG tablet Commonly known as:  ADVIL,MOTRIN Take 1 tablet (600 mg total) by mouth every 6 (six) hours as needed.   oxyCODONE 5 MG immediate release tablet Commonly known as:  Oxy IR/ROXICODONE Take 1 tablet (5 mg total) by mouth every 4 (four) hours as needed (pain scale 4-7).   PRENATAL VITAMIN PO Take 1 tablet by mouth every morning.   senna-docusate 8.6-50 MG tablet Commonly known as:  Senokot-S Take 2 tablets by mouth daily. Start taking on:  09/13/2016       Diet: routine diet  Activity: Advance as tolerated. Pelvic rest for 6 weeks.   Outpatient follow up:6 weeks Follow up Appt:No future appointments. Follow up Visit:No Follow-up on file.  Postpartum contraception: Depo Provera  Newborn Data: Live born female  Birth Weight: 7 lb 1.4 oz (3215 g) APGAR: 8, 9  Baby Feeding: Bottle Disposition:home with  mother   09/12/2016 Cam Hai, CNM  9:12 AM

## 2016-09-13 LAB — TYPE AND SCREEN
ABO/RH(D): O POS
ANTIBODY SCREEN: POSITIVE
DAT, IgG: NEGATIVE
DONOR AG TYPE: NEGATIVE
Donor AG Type: NEGATIVE
Donor AG Type: NEGATIVE
Donor AG Type: NEGATIVE
PT AG Type: NEGATIVE
UNIT DIVISION: 0
Unit division: 0
Unit division: 0
Unit division: 0

## 2016-09-14 ENCOUNTER — Inpatient Hospital Stay (HOSPITAL_COMMUNITY): Payer: No Typology Code available for payment source

## 2017-01-03 ENCOUNTER — Encounter (HOSPITAL_COMMUNITY): Payer: Self-pay

## 2017-02-21 NOTE — Anesthesia Postprocedure Evaluation (Signed)
Anesthesia Post Note  Patient: Brianna Grant  Procedure(s) Performed: Procedure(s) (LRB): CESAREAN SECTION (N/A)     Anesthesia Post Evaluation  Last Vitals:  Vitals:   09/11/16 2015 09/12/16 0600  BP: (!) 128/58 107/60  Pulse: 88 88  Resp: 18 20  Temp: 36.4 C 36.7 C    Last Pain:  Vitals:   09/12/16 0620  TempSrc:   PainSc: 6                  [name] EDWARD

## 2017-02-21 NOTE — Addendum Note (Signed)
Addendum  created 02/21/17 1632 by Cristela BlueJackson, Kaien Pezzullo, MD   Sign clinical note
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
## 2017-06-08 IMAGING — US US MFM FETAL NUCHAL TRANSLUCENCY
1 series · 14 of 28 positions shown · non-contrast
Comparison: none

[REDACTED]-
Faculty Physician
FERDUSA NP

TRANSLUCENCY
1  DITTE-MARIE BACH-ANDERSEN            [id number]      [id number]     [id number]
Indications
12 weeks gestation of pregnancy
First trimester aneuploidy screen (NT)         Z36
Fetal Evaluation
Num Of Fetuses:     1
Preg. Location:     Intrauterine
Gest. Sac:          Intrauterine
Fetal Pole:         Visualized
Fetal Heart         155
Rate(bpm):
Cardiac Activity:   Observed
Biometry
CRL:      56.8  mm     G. Age:  12w 1d                  EDD:   09/11/16
Gestational Age
LMP:           12w 5d       Date:   12/02/15                 EDD:   09/07/16
Best:          12w 5d    Det. By:   LMP  (12/02/15)          EDD:   09/07/16
1st Trimester Genetic Sonogram Screening
CRL:            56.8  mm    G. Age:   12w 1d                 EDD:   09/11/16
Nasal Bone:                 Present
Anatomy
Cranium:               Appears normal         Bladder:                Appears normal
Choroid Plexus:        Appears normal         Upper Extremities:      Visualized
Stomach:               Visualized             Lower Extremities:      Visualized
Cervix Uterus Adnexa
Cervix
Closed.
Uterus
No abnormality visualized.
Left Ovary
Small corpus luteum noted.
Right Ovary
Within normal limits.
Cul De Sac:   No free fluid seen.
Adnexa:       No abnormality visualized.
Impression
INDICATION: 24 yr old YYPGIIG at 20w6d for first trimester
screen.

[Series 1: us mfm fetal nuchal translucency · 14 of 37 slices shown]
[im 2/37]
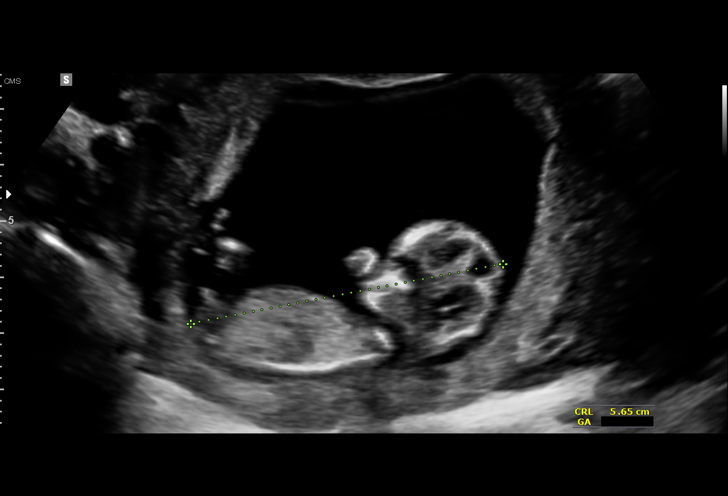
[im 5/37]
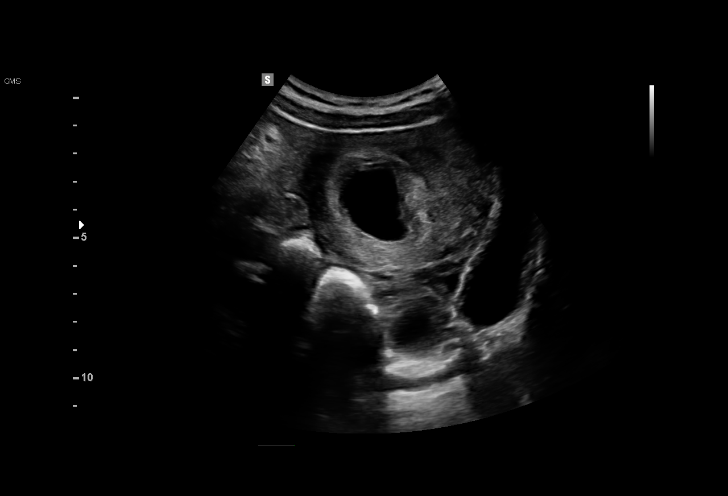
[im 7/37]
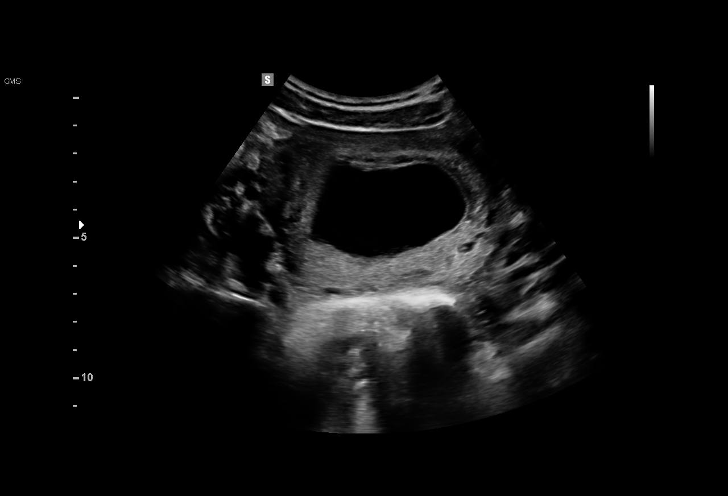
[im 10/37]
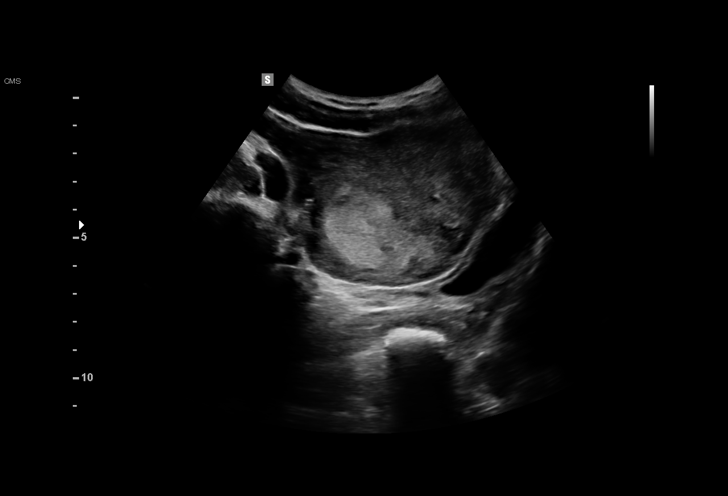
[im 13/37]
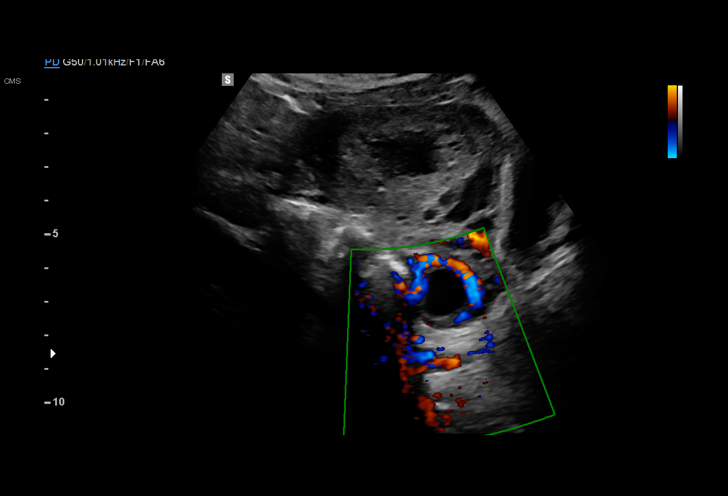
[im 15/37]
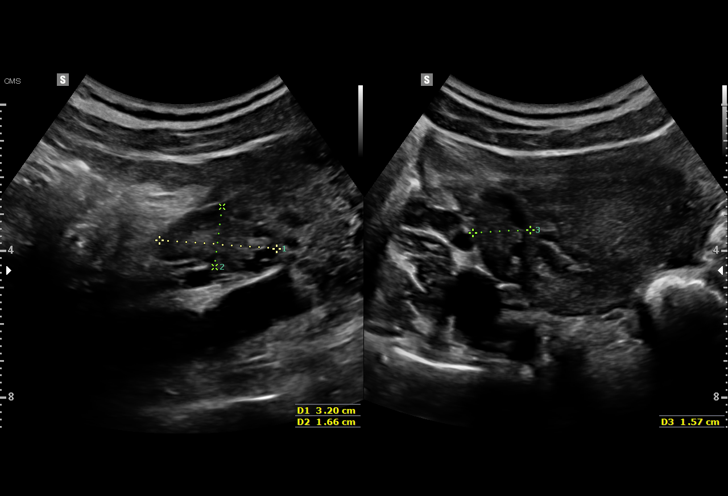
[im 18/37]
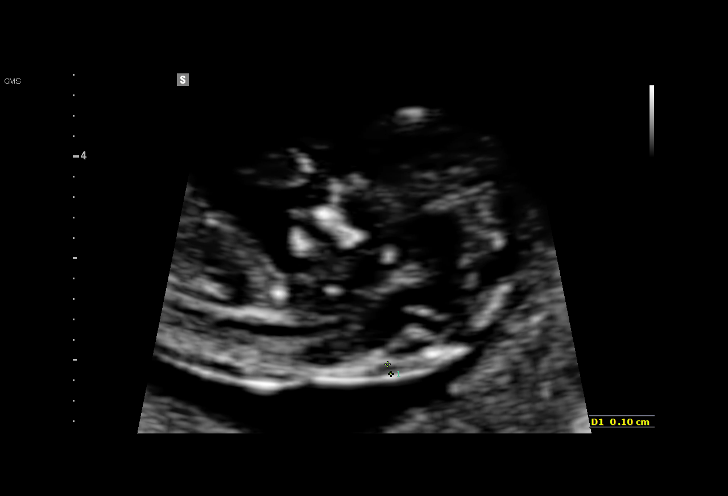
[im 21/37]
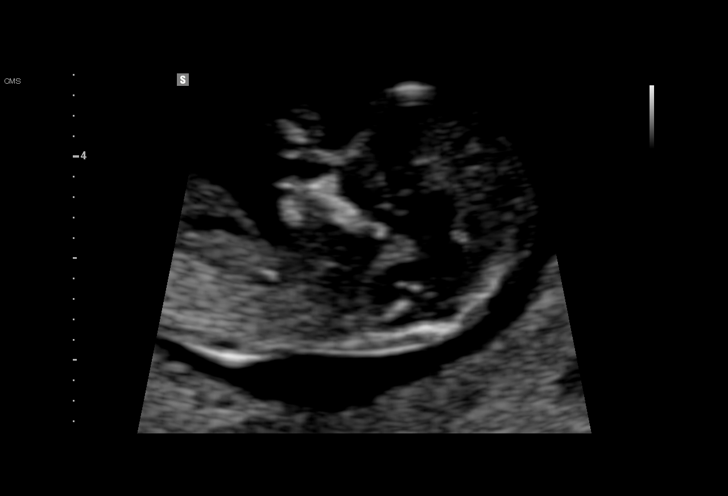
[im 23/37]
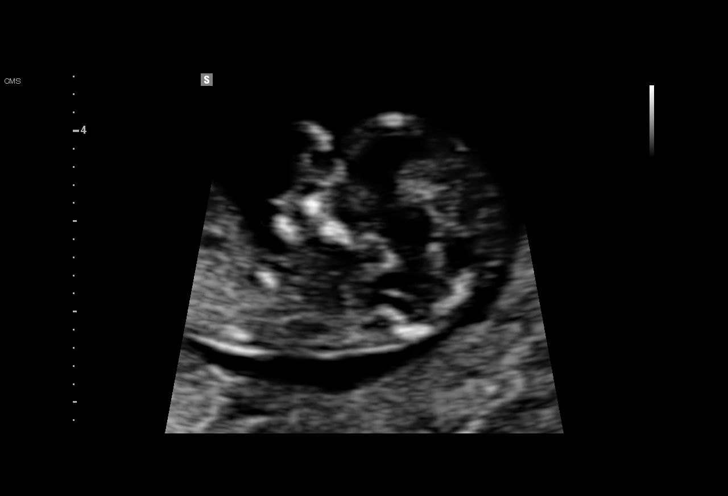
[im 26/37]
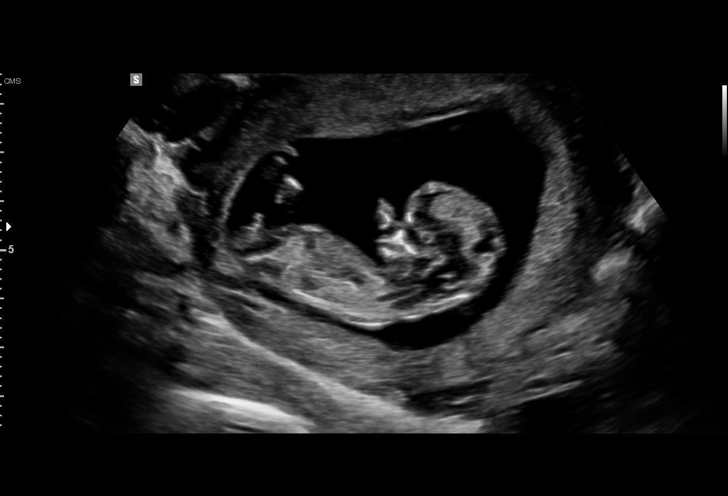
[im 29/37]
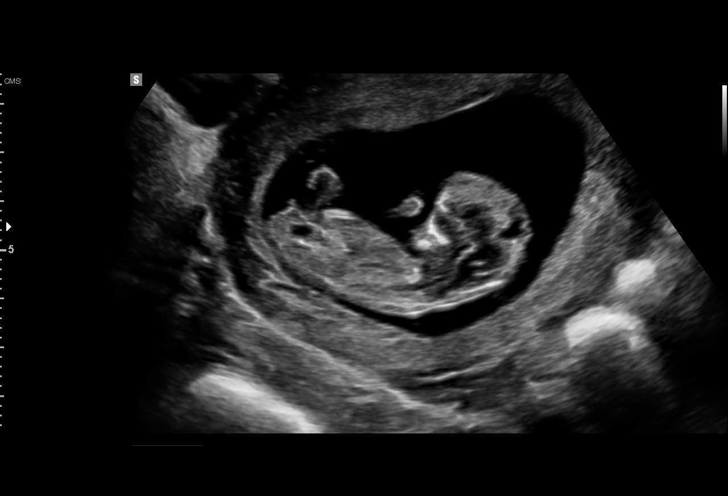
[im 31/37]
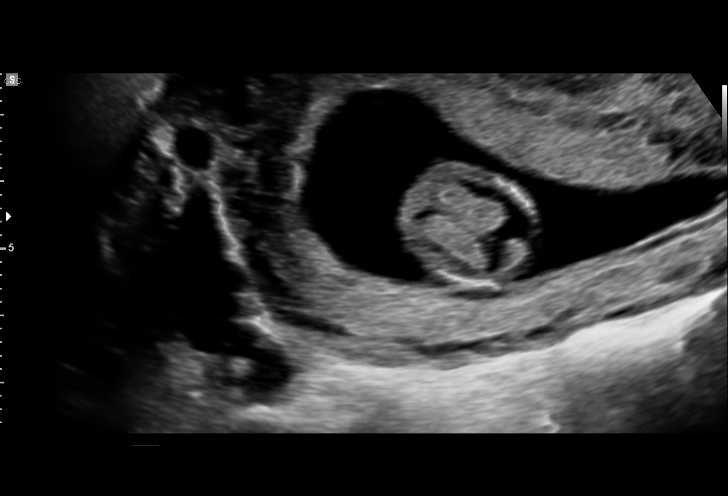
[im 34/37]
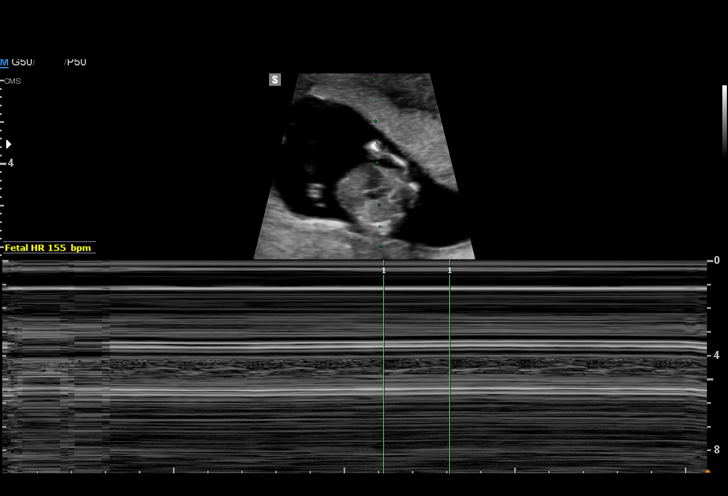
[im 37/37]
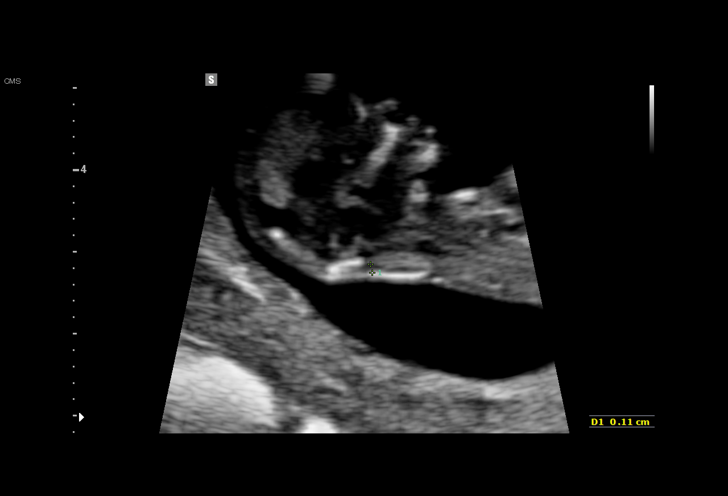

[14 of 28 positions shown; findings below may reference images not displayed]

FINDINGS: 1. Single intrauterine pregnancy.
2. Fetal crown rump length is consistent with dating.
3. Normal uterus; there is a left corpus luteum cyst.
4. Evaluation of fetal anatomy is limited by early gestational
age.
5. Normal nuchal translucency measuring2.2mm.
6. The nasal bone is visualized.
Recommendations

1. Appropriate dating.
2. First trimester screen done today:
- discussed limtiations of screening tests in detecting fetal
aneuploidy
3. Recommend offer maternal serum AFP at 15-20 weeks
4. Recommend fetal anatomic survey at 18-20 weeks

## 2019-09-04 ENCOUNTER — Other Ambulatory Visit: Payer: Self-pay

## 2019-09-04 ENCOUNTER — Encounter (HOSPITAL_COMMUNITY): Payer: Self-pay | Admitting: Emergency Medicine

## 2019-09-04 ENCOUNTER — Emergency Department (HOSPITAL_COMMUNITY)
Admission: EM | Admit: 2019-09-04 | Discharge: 2019-09-04 | Disposition: A | Discharge status: Home / Self Care | Payer: Self-pay | Attending: Emergency Medicine | Admitting: Emergency Medicine

## 2019-09-04 DIAGNOSIS — B9689 Other specified bacterial agents as the cause of diseases classified elsewhere: Secondary | ICD-10-CM

## 2019-09-04 DIAGNOSIS — B3731 Acute candidiasis of vulva and vagina: Secondary | ICD-10-CM

## 2019-09-04 DIAGNOSIS — B373 Candidiasis of vulva and vagina: Secondary | ICD-10-CM | POA: Insufficient documentation

## 2019-09-04 DIAGNOSIS — N760 Acute vaginitis: Secondary | ICD-10-CM | POA: Insufficient documentation

## 2019-09-04 DIAGNOSIS — F17200 Nicotine dependence, unspecified, uncomplicated: Secondary | ICD-10-CM | POA: Insufficient documentation

## 2019-09-04 LAB — I-STAT BETA HCG BLOOD, ED (MC, WL, AP ONLY): I-stat hCG, quantitative: <5 m[IU]/mL (ref <5)

## 2019-09-04 LAB — HIV ANTIBODY (ROUTINE TESTING W REFLEX): HIV Screen 4th Generation wRfx: NONREACTIVE

## 2019-09-04 LAB — WET PREP, GENITAL
Sperm: NONE SEEN
Trich, Wet Prep: NONE SEEN

## 2019-09-04 LAB — RPR: RPR Ser Ql: NONREACTIVE

## 2019-09-04 MED ORDER — FLUCONAZOLE 150 MG PO TABS: 150.0000 mg | ORAL_TABLET | Freq: Once | ORAL | 1 refills | Status: AC

## 2019-09-04 MED ORDER — METRONIDAZOLE 500 MG PO TABS: 500.0000 mg | ORAL_TABLET | Freq: Two times a day (BID) | ORAL | 0 refills | Status: AC

## 2019-09-04 NOTE — ED Triage Notes (Signed)
Pt complaining of vaginal issues that started about 2 days ago. Stating her vagina is inflammed and having yeast/itching. Used over the counter medication without much relief. Pt not having any urinary symptoms other than "yellow-green" color. Hx of BV. Recently had change in sexual partners about 1 week ago, did not use protection.

## 2019-09-04 NOTE — ED Provider Notes (Signed)
[name] EMERGENCY DEPARTMENT Provider Note   CSN: [id number] Arrival date & time: 09/04/19  6010     History No chief complaint on file.   Brianna Grant is a 29 y.o. female.  29 year old female presents with complaint of vaginal itching and discharge onset yesterday, no improvement with OTC treatment, reports sexually active with new partner, does not use condoms.  Patient is on Depo.  Denies abdominal pain, fevers, changes in bowel or bladder habits.  No other complaints or concerns.  Prior history of chlamydia which was treated.        Past Medical History:  Diagnosis Date  . BV (bacterial vaginosis)     Patient Active Problem List   Diagnosis Date Noted  . Indication for care in labor or delivery 09/09/2016    Past Surgical History:  Procedure Laterality Date  . CESAREAN SECTION N/A 09/10/2016   Procedure: CESAREAN SECTION;  Surgeon: [place] Bing, MD;  Location: [facility] BIRTHING SUITES;  Service: Obstetrics;  Laterality: N/A;     OB History    Gravida  2   Para  1   Term  1   Preterm      AB      Living  1     SAB      TAB      Ectopic      Multiple      Live Births              No family history on file.  Social History   Tobacco Use  . Smoking status: Current Some Day Smoker  . Smokeless tobacco: Never Used  Substance Use Topics  . Alcohol use: No  . Drug use: No    Home Medications Prior to Admission medications   Medication Sig Start Date End Date Taking? Authorizing Provider  ferrous fumarate (HEMOCYTE - 106 MG FE) 325 (106 Fe) MG TABS tablet Take 2 tablets (212 mg of iron total) by mouth daily. 09/12/16   Arabella Merles, CNM  fluconazole (DIFLUCAN) 150 MG tablet Take 1 tablet (150 mg total) by mouth once for 1 dose. Take 1 tablet today, repeat in 1 week if symptoms continue 09/04/19 09/04/19  Army Melia A, PA-C  ibuprofen (ADVIL,MOTRIN) 600 MG tablet Take 1 tablet (600 mg total) by mouth every 6 (six)  hours as needed. 09/12/16   Arabella Merles, CNM  metroNIDAZOLE (FLAGYL) 500 MG tablet Take 1 tablet (500 mg total) by mouth 2 (two) times daily. 09/04/19   Jeannie Fend, PA-C  oxyCODONE (OXY IR/ROXICODONE) 5 MG immediate release tablet Take 1 tablet (5 mg total) by mouth every 4 (four) hours as needed (pain scale 4-7). 09/12/16   Arabella Merles, CNM  Prenatal Vit-Fe Fumarate-FA (PRENATAL VITAMIN PO) Take 1 tablet by mouth every morning.     [provider]  senna-docusate (SENOKOT-S) 8.6-50 MG tablet Take 2 tablets by mouth daily. 09/13/16   Arabella Merles, CNM    Allergies    Patient has no known allergies.  Review of Systems   Review of Systems  Constitutional: Negative for fever.  Gastrointestinal: Negative for abdominal pain, constipation, diarrhea, nausea and vomiting.  Genitourinary: Positive for vaginal discharge. Negative for dysuria and frequency.  Musculoskeletal: Negative for arthralgias and myalgias.  Skin: Negative for rash and wound.  Allergic/Immunologic: Negative for immunocompromised state.  Hematological: Negative for adenopathy.  Psychiatric/Behavioral: Negative for confusion.  All other systems reviewed and are negative.   Physical  Exam Updated Vital Signs BP 107/64   Pulse 72   Temp 98.4 F (36.9 C)   Resp 16   Ht 5\' 1"  (1.549 m)   Wt 48.1 kg   SpO2 99%   BMI 20.03 kg/m   Physical Exam Vitals and nursing note reviewed. Exam conducted with a chaperone present.  Constitutional:      General: She is not in acute distress.    Appearance: She is well-developed. She is not diaphoretic.  HENT:     Head: Normocephalic and atraumatic.  Pulmonary:     Effort: Pulmonary effort is normal.  Abdominal:     Palpations: Abdomen is soft.     Tenderness: There is no abdominal tenderness.  Genitourinary:    Vagina: Vaginal discharge present.     Cervix: No cervical motion tenderness or friability.     Uterus: Not enlarged and not tender.      Adnexa:         Right: No mass, tenderness or fullness.         Left: No mass, tenderness or fullness.       Comments: Small amount of white thick discharge present. Musculoskeletal:     Right lower leg: No edema.     Left lower leg: No edema.  Skin:    General: Skin is warm and dry.     Findings: No erythema or rash.  Neurological:     Mental Status: She is alert and oriented to person, place, and time.  Psychiatric:        Behavior: Behavior normal.     ED Results / Procedures / Treatments   Labs (all labs ordered are listed, but only abnormal results are displayed) Labs Reviewed  WET PREP, GENITAL - Abnormal; Notable for the following components:      Result Value   Yeast Wet Prep HPF POC   (*)    Value: Swab received with less than 0.5 mL of saline, saline added to specimen, interpret results with caution.   Clue Cells Wet Prep HPF POC PRESENT (*)    WBC, Wet Prep HPF POC MANY (*)    All other components within normal limits  HIV ANTIBODY (ROUTINE TESTING W REFLEX)  RPR  I-STAT BETA HCG BLOOD, ED ([facility], [facility], [facility] ONLY)  GC/CHLAMYDIA PROBE AMP ([place]) NOT AT [facility]    EKG None  Radiology No results found.  Procedures Procedures (including critical care time)  Medications Ordered in ED Medications - No data to display  ED Course  I have reviewed the triage vital signs and the nursing notes.  Pertinent labs & imaging results that were available during my care of the patient were reviewed by me and considered in my medical decision making (see chart for details).  Clinical Course as of Sep 04 835  Sat Sep 04, 2019  5339 29 year old female presents with complaint of vaginal itching and discharge x2 days with history of new sexual partner without use of condom.  On exam patient has a small amount of thick white vaginal discharge, no adnexal tenderness, no cervical motion tenderness. Patient is afebrile, vitals within normal limits.  Do not suspect PID.  Wet prep is positive  for clue cells and yeast, will treat with Diflucan and Flagyl.  hCG is negative, patient is on Depo.  Patient is advised HIV, RPR, gonorrhea and chlamydia are all sent out and should follow-up for her results.  If positive, patient should present to the health department or women's  clinic for treatment.   [LM]    Clinical Course User Index [LM] Roque Lias   MDM Rules/Calculators/A&P                      Final Clinical Impression(s) / ED Diagnoses Final diagnoses:  Bacterial vaginosis  Vaginal yeast infection    Rx / DC Orders ED Discharge Orders         Ordered    metroNIDAZOLE (FLAGYL) 500 MG tablet  2 times daily     09/04/19 0756    fluconazole (DIFLUCAN) 150 MG tablet   Once     09/04/19 0759           Tacy Learn, PA-C 09/04/19 4734    Margette Fast, MD 09/04/19 [phone]

## 2019-09-04 NOTE — Discharge Instructions (Signed)
Center for [facility] [address] [place], [place] [phone] Walk in GYN clinic 4PM-7:30PM Monday-Wednesday  Take Flagyl and Diflucan as prescribed. Follow-up with your provider or women's clinic if symptoms persist. Follow-up for your HIV, syphilis, gonorrhea and chlamydia test results.  If any of these are positive you may follow-up with the health department for free treatment.

## 2019-09-06 LAB — GC/CHLAMYDIA PROBE AMP (~~LOC~~) NOT AT ARMC
Chlamydia: NEGATIVE
Neisseria Gonorrhea: POSITIVE — AB

## 2019-09-08 ENCOUNTER — Telehealth: Payer: Self-pay | Admitting: Family

## 2019-09-08 DIAGNOSIS — A549 Gonococcal infection, unspecified: Secondary | ICD-10-CM

## 2019-09-08 NOTE — Progress Notes (Signed)
Based on what you shared with me, I feel your condition warrants further evaluation and I recommend that you be seen for a face to face office visit.  Hello, I am sorry you had a bad experience! However, I can not treat Gonorrhea over an evist as you need to be seen face to face for testing.    NOTE: If you entered your credit card information for this eVisit, you will not be charged. You may see a "hold" on your card for the $35 but that hold will drop off and you will not have a charge processed.   If you are having a true medical emergency please call 911.      For an urgent face to face visit, [place] has five urgent care centers for your convenience:      NEW:  [facility] Health Urgent Care Center at [facility] Directions [phone] [address] [place], [place] [address] . 10 am - 6pm Monday - Friday    [facility] Health Urgent Care Center [facility]) Get Driving Directions [phone] [address] [place], [place] [address] . 10 am to 8 pm Monday-Friday . 12 pm to 8 pm [facility] Urgent Care at [facility] Get Driving Directions [phone] [address], [address] [place], [place] [address] . 8 am to 8 pm Monday-Friday . 9 am to 6 pm Saturday . 11 am to 6 pm Sunday     [facility] Health Urgent Care at [facility] Get Driving Directions  [phone] [address] [place], [place] [address] . 8 am to 8 pm Monday-Friday . 8 am to 4 pm [facility] Urgent Care at [facility] Directions [phone] [address] [place], [place] [address] . 12 pm to 6 pm Monday-Friday      Your e-visit answers were reviewed by a board certified advanced clinical practitioner to complete your personal care plan.  Thank you for using e-Visits.

## 2019-09-09 ENCOUNTER — Ambulatory Visit (INDEPENDENT_AMBULATORY_CARE_PROVIDER_SITE_OTHER): Payer: Self-pay | Admitting: General Practice

## 2019-09-09 ENCOUNTER — Other Ambulatory Visit: Payer: Self-pay

## 2019-09-09 DIAGNOSIS — A549 Gonococcal infection, unspecified: Secondary | ICD-10-CM

## 2019-09-09 MED ORDER — CEFTRIAXONE SODIUM 250 MG IJ SOLR
250.0000 mg | Freq: Once | INTRAMUSCULAR | Status: AC
  Administered 2019-09-09: 17:00:00 500 mg via INTRAMUSCULAR

## 2019-09-09 NOTE — Progress Notes (Signed)
Patient presents to office today for treatment of gonorrhea. Rocephin 500mg  IM given per Dr . Injection administered without complication. Patient is aware of importance of partner treatment and abstaining from intercourse for 2 weeks. Patient will return in 4 weeks for test of cure.   Alysia Penna RN BSN 09/09/19

## 2019-09-10 NOTE — Progress Notes (Signed)
Agree with A & P. 

## 2019-10-07 ENCOUNTER — Ambulatory Visit: Payer: Self-pay

## 2020-07-18 ENCOUNTER — Other Ambulatory Visit: Payer: Self-pay

## 2020-07-18 ENCOUNTER — Emergency Department (HOSPITAL_COMMUNITY)
Admission: EM | Admit: 2020-07-18 | Discharge: 2020-07-19 | Disposition: A | Discharge status: Home / Self Care | Payer: Self-pay | Attending: Emergency Medicine | Admitting: Emergency Medicine

## 2020-07-18 DIAGNOSIS — F17200 Nicotine dependence, unspecified, uncomplicated: Secondary | ICD-10-CM | POA: Insufficient documentation

## 2020-07-18 DIAGNOSIS — N764 Abscess of vulva: Secondary | ICD-10-CM | POA: Insufficient documentation

## 2020-07-18 DIAGNOSIS — L0291 Cutaneous abscess, unspecified: Secondary | ICD-10-CM

## 2020-07-18 NOTE — ED Triage Notes (Signed)
Pt reports having abscess on perineal area which recently grew larger and then popped, draining for a few days. Reports pain and residual swelling to same.

## 2020-07-19 MED ORDER — DOXYCYCLINE HYCLATE 100 MG PO TABS
100.0000 mg | ORAL_TABLET | Freq: Once | ORAL | Status: AC
  Administered 2020-07-19: 100 mg via ORAL
  Filled 2020-07-19: qty 1

## 2020-07-19 MED ORDER — DOXYCYCLINE HYCLATE 100 MG PO CAPS: 100.0000 mg | ORAL_CAPSULE | Freq: Two times a day (BID) | ORAL | 0 refills | Status: AC

## 2020-07-19 NOTE — ED Provider Notes (Signed)
[facility] EMERGENCY DEPARTMENT Provider Note   CSN: [id number] Arrival date & time: 07/18/20  1652     History Chief Complaint  Patient presents with  . Abscess    Brianna Grant is a 29 y.o. female.  Patient presents to the emergency department for evaluation of an abscess near her vagina. Patient reports that she has always had a pea-sized lump in the area for the last 2 years. This week, however the area nearly doubled in size and then started draining 2 days ago.        Past Medical History:  Diagnosis Date  . BV (bacterial vaginosis)     Patient Active Problem List   Diagnosis Date Noted  . Indication for care in labor or delivery 09/09/2016    Past Surgical History:  Procedure Laterality Date  . CESAREAN SECTION N/A 09/10/2016   Procedure: CESAREAN SECTION;  Surgeon: Savannah Bing, MD;  Location: [facility] BIRTHING SUITES;  Service: Obstetrics;  Laterality: N/A;     OB History    Gravida  2   Para  1   Term  1   Preterm      AB      Living  1     SAB      TAB      Ectopic      Multiple      Live Births              No family history on file.  Social History   Tobacco Use  . Smoking status: Current Some Day Smoker  . Smokeless tobacco: Never Used  Substance Use Topics  . Alcohol use: No  . Drug use: No    Home Medications Prior to Admission medications   Medication Sig Start Date End Date Taking? Authorizing Provider  doxycycline (VIBRAMYCIN) 100 MG capsule Take 1 capsule (100 mg total) by mouth 2 (two) times daily. 07/19/20   Gilda Crease, MD  ferrous fumarate (HEMOCYTE - 106 MG FE) 325 (106 Fe) MG TABS tablet Take 2 tablets (212 mg of iron total) by mouth daily. 09/12/16   Arabella Merles, CNM  ibuprofen (ADVIL,MOTRIN) 600 MG tablet Take 1 tablet (600 mg total) by mouth every 6 (six) hours as needed. 09/12/16   Arabella Merles, CNM  metroNIDAZOLE (FLAGYL) 500 MG tablet Take 1 tablet (500 mg total) by  mouth 2 (two) times daily. 09/04/19   Jeannie Fend, PA-C  oxyCODONE (OXY IR/ROXICODONE) 5 MG immediate release tablet Take 1 tablet (5 mg total) by mouth every 4 (four) hours as needed (pain scale 4-7). 09/12/16   Arabella Merles, CNM  Prenatal Vit-Fe Fumarate-FA (PRENATAL VITAMIN PO) Take 1 tablet by mouth every morning.     [provider]  senna-docusate (SENOKOT-S) 8.6-50 MG tablet Take 2 tablets by mouth daily. 09/13/16   Arabella Merles, CNM    Allergies    Patient has no known allergies.  Review of Systems   Review of Systems  Constitutional: Negative for fever.  Skin: Positive for wound.    Physical Exam Updated Vital Signs BP 101/65 (BP Location: Right Arm)   Pulse 68   Temp 98.7 F (37.1 C) (Oral)   Resp 18   LMP 07/13/2020 (Approximate)   SpO2 99%   Physical Exam Vitals and nursing note reviewed. Exam conducted with a chaperone present.  Constitutional:      Appearance: Normal appearance.  HENT:     Head: Normocephalic and  atraumatic.  Pulmonary:     Effort: Pulmonary effort is normal.  Genitourinary:    Labia:        Left: Tenderness and lesion present.     Lymphadenopathy:     Lower Body: Left inguinal adenopathy present.  Neurological:     Mental Status: She is alert.     ED Results / Procedures / Treatments   Labs (all labs ordered are listed, but only abnormal results are displayed) Labs Reviewed - No data to display  EKG None  Radiology No results found.  Procedures Procedures (including critical care time)  Medications Ordered in ED Medications  doxycycline (VIBRA-TABS) tablet 100 mg (has no administration in time range)    ED Course  I have reviewed the triage vital signs and the nursing notes.  Pertinent labs & imaging results that were available during my care of the patient were reviewed by me and considered in my medical decision making (see chart for details).    MDM Rules/Calculators/A&P                           Patient with a spontaneously draining labial abscess, likely of previously existing cystic lesion. No palpable fluctuance or fluid collections that need to be drained at this time. Instructed on sitz bath and will initiate doxycycline.  Final Clinical Impression(s) / ED Diagnoses Final diagnoses:  Abscess    Rx / DC Orders ED Discharge Orders         Ordered    doxycycline (VIBRAMYCIN) 100 MG capsule  2 times daily        07/19/20 0125           Gilda Crease, MD 07/19/20 0128
